# Patient Record
Sex: Male | Born: 2000 | Race: White | Hispanic: No | Marital: Single | Smoking: Current every day smoker
Health system: Southern US, Community
[De-identification: ages and names within clinical notes are randomized; demographics above are authoritative.]

## PROBLEM LIST (undated history)

## (undated) DIAGNOSIS — F909 Attention-deficit hyperactivity disorder, unspecified type: Secondary | ICD-10-CM

## (undated) HISTORY — DX: Attention-deficit hyperactivity disorder, unspecified type: F90.9

## (undated) HISTORY — PX: OTHER SURGICAL HISTORY: SHX169

---
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
## 2020-03-19 DIAGNOSIS — J029 Acute pharyngitis, unspecified: Secondary | ICD-10-CM | POA: Diagnosis not present

## 2020-03-19 DIAGNOSIS — Z00129 Encounter for routine child health examination without abnormal findings: Secondary | ICD-10-CM | POA: Diagnosis not present

## 2020-03-19 DIAGNOSIS — Z68.41 Body mass index (BMI) pediatric, less than 5th percentile for age: Secondary | ICD-10-CM | POA: Diagnosis not present

## 2020-09-11 ENCOUNTER — Emergency Department (HOSPITAL_COMMUNITY)
Admission: EM | Admit: 2020-09-11 | Discharge: 2020-09-11 | Disposition: A | Discharge status: Other Acute Inpatient Hospital | Payer: Medicaid Other | Source: Home / Self Care | Attending: Emergency Medicine | Admitting: Emergency Medicine

## 2020-09-11 ENCOUNTER — Encounter (HOSPITAL_COMMUNITY): Payer: Self-pay | Admitting: *Deleted

## 2020-09-11 ENCOUNTER — Other Ambulatory Visit: Payer: Self-pay

## 2020-09-11 ENCOUNTER — Emergency Department (HOSPITAL_COMMUNITY): Admission: EM | Admit: 2020-09-11 | Discharge: 2020-09-11 | Discharge status: Against Medical Advice | Payer: 59

## 2020-09-11 ENCOUNTER — Other Ambulatory Visit: Payer: Self-pay | Admitting: Psychiatric/Mental Health

## 2020-09-11 ENCOUNTER — Inpatient Hospital Stay (HOSPITAL_COMMUNITY)
Admission: AD | Admit: 2020-09-11 | Discharge: 2020-09-20 | DRG: 885 | Disposition: A | Discharge status: Home / Self Care | Payer: Medicaid Other | Source: Intra-hospital | Attending: Psychiatry | Admitting: Psychiatry

## 2020-09-11 DIAGNOSIS — F29 Unspecified psychosis not due to a substance or known physiological condition: Secondary | ICD-10-CM | POA: Diagnosis present

## 2020-09-11 DIAGNOSIS — F209 Schizophrenia, unspecified: Principal | ICD-10-CM | POA: Diagnosis present

## 2020-09-11 DIAGNOSIS — Z20822 Contact with and (suspected) exposure to covid-19: Secondary | ICD-10-CM | POA: Diagnosis present

## 2020-09-11 DIAGNOSIS — Z8249 Family history of ischemic heart disease and other diseases of the circulatory system: Secondary | ICD-10-CM | POA: Diagnosis not present

## 2020-09-11 DIAGNOSIS — F1910 Other psychoactive substance abuse, uncomplicated: Secondary | ICD-10-CM | POA: Insufficient documentation

## 2020-09-11 DIAGNOSIS — F1310 Sedative, hypnotic or anxiolytic abuse, uncomplicated: Secondary | ICD-10-CM | POA: Diagnosis present

## 2020-09-11 DIAGNOSIS — F1210 Cannabis abuse, uncomplicated: Secondary | ICD-10-CM | POA: Diagnosis present

## 2020-09-11 DIAGNOSIS — F1410 Cocaine abuse, uncomplicated: Secondary | ICD-10-CM | POA: Diagnosis present

## 2020-09-11 DIAGNOSIS — F1290 Cannabis use, unspecified, uncomplicated: Secondary | ICD-10-CM | POA: Insufficient documentation

## 2020-09-11 DIAGNOSIS — F17210 Nicotine dependence, cigarettes, uncomplicated: Secondary | ICD-10-CM | POA: Diagnosis present

## 2020-09-11 DIAGNOSIS — F23 Brief psychotic disorder: Secondary | ICD-10-CM | POA: Diagnosis not present

## 2020-09-11 DIAGNOSIS — Z046 Encounter for general psychiatric examination, requested by authority: Secondary | ICD-10-CM

## 2020-09-11 DIAGNOSIS — Z00129 Encounter for routine child health examination without abnormal findings: Secondary | ICD-10-CM

## 2020-09-11 DIAGNOSIS — K5900 Constipation, unspecified: Secondary | ICD-10-CM | POA: Diagnosis present

## 2020-09-11 DIAGNOSIS — I456 Pre-excitation syndrome: Secondary | ICD-10-CM | POA: Diagnosis present

## 2020-09-11 DIAGNOSIS — R258 Other abnormal involuntary movements: Secondary | ICD-10-CM | POA: Diagnosis not present

## 2020-09-11 DIAGNOSIS — R42 Dizziness and giddiness: Secondary | ICD-10-CM | POA: Insufficient documentation

## 2020-09-11 DIAGNOSIS — F419 Anxiety disorder, unspecified: Secondary | ICD-10-CM | POA: Diagnosis present

## 2020-09-11 DIAGNOSIS — F1994 Other psychoactive substance use, unspecified with psychoactive substance-induced mood disorder: Secondary | ICD-10-CM | POA: Diagnosis present

## 2020-09-11 DIAGNOSIS — G4700 Insomnia, unspecified: Secondary | ICD-10-CM | POA: Diagnosis present

## 2020-09-11 DIAGNOSIS — F909 Attention-deficit hyperactivity disorder, unspecified type: Secondary | ICD-10-CM | POA: Insufficient documentation

## 2020-09-11 DIAGNOSIS — R259 Unspecified abnormal involuntary movements: Secondary | ICD-10-CM | POA: Insufficient documentation

## 2020-09-11 LAB — COMPREHENSIVE METABOLIC PANEL
ALT: 13 U/L (ref 0–44)
AST: 19 U/L (ref 15–41)
Albumin: 4.8 g/dL (ref 3.5–5.0)
Alkaline Phosphatase: 58 U/L (ref 38–126)
Anion gap: 11 (ref 5–15)
BUN: 13 mg/dL (ref 6–20)
CO2: 25 mmol/L (ref 22–32)
Calcium: 10 mg/dL (ref 8.9–10.3)
Chloride: 101 mmol/L (ref 98–111)
Creatinine, Ser: 0.86 mg/dL (ref 0.61–1.24)
GFR, Estimated: >60 mL/min (ref >60)
Glucose, Bld: 90 mg/dL (ref 70–99)
Potassium: 4 mmol/L (ref 3.5–5.1)
Sodium: 137 mmol/L (ref 135–145)
Total Bilirubin: 0.6 mg/dL (ref 0.3–1.2)
Total Protein: 7.7 g/dL (ref 6.5–8.1)

## 2020-09-11 LAB — CBC
HCT: 45.8 % (ref 39.0–52.0)
Hemoglobin: 15.9 g/dL (ref 13.0–17.0)
MCH: 33.4 pg (ref 26.0–34.0)
MCHC: 34.7 g/dL (ref 30.0–36.0)
MCV: 96.2 fL (ref 80.0–100.0)
Platelets: 165 10*3/uL (ref 150–400)
RBC: 4.76 MIL/uL (ref 4.22–5.81)
RDW: 12.4 % (ref 11.5–15.5)
WBC: 9.4 10*3/uL (ref 4.0–10.5)
nRBC: 0 % (ref 0.0–0.2)

## 2020-09-11 LAB — ETHANOL: Alcohol, Ethyl (B): <10 mg/dL (ref <10)

## 2020-09-11 LAB — RESP PANEL BY RT-PCR (FLU A&B, COVID) ARPGX2
Influenza A by PCR: NEGATIVE
Influenza B by PCR: NEGATIVE
SARS Coronavirus 2 by RT PCR: NEGATIVE

## 2020-09-11 LAB — SALICYLATE LEVEL: Salicylate Lvl: <7 mg/dL — ABNORMAL LOW (ref 7.0–30.0)

## 2020-09-11 LAB — RAPID URINE DRUG SCREEN, HOSP PERFORMED
Amphetamines: NOT DETECTED
Barbiturates: NOT DETECTED
Benzodiazepines: NOT DETECTED
Cocaine: NOT DETECTED
Opiates: NOT DETECTED
Tetrahydrocannabinol: POSITIVE — AB

## 2020-09-11 LAB — ACETAMINOPHEN LEVEL: Acetaminophen (Tylenol), Serum: <10 ug/mL — ABNORMAL LOW (ref 10–30)

## 2020-09-11 MED ORDER — ACETAMINOPHEN 325 MG PO TABS
650.0000 mg | ORAL_TABLET | Freq: Four times a day (QID) | ORAL | Status: DC | PRN
  Administered 2020-09-17: 650 mg via ORAL
  Filled 2020-09-11: qty 2

## 2020-09-11 MED ORDER — STERILE WATER FOR INJECTION IJ SOLN
INTRAMUSCULAR | Status: AC
  Filled 2020-09-11: qty 10

## 2020-09-11 MED ORDER — LORAZEPAM 1 MG PO TABS
1.0000 mg | ORAL_TABLET | Freq: Every day | ORAL | Status: DC | PRN
  Administered 2020-09-12 – 2020-09-14 (×2): 1 mg via ORAL
  Filled 2020-09-11 (×2): qty 1

## 2020-09-11 MED ORDER — OLANZAPINE 10 MG IM SOLR
5.0000 mg | Freq: Once | INTRAMUSCULAR | Status: AC
  Administered 2020-09-11: 5 mg via INTRAMUSCULAR
  Filled 2020-09-11: qty 10

## 2020-09-11 MED ORDER — RISPERIDONE 1 MG PO TBDP
1.0000 mg | ORAL_TABLET | Freq: Two times a day (BID) | ORAL | Status: DC
  Administered 2020-09-12 (×2): 1 mg via ORAL
  Filled 2020-09-11 (×5): qty 1

## 2020-09-11 MED ORDER — ZIPRASIDONE MESYLATE 20 MG IM SOLR
20.0000 mg | Freq: Every day | INTRAMUSCULAR | Status: DC | PRN
  Administered 2020-09-14: 02:00:00 20 mg via INTRAMUSCULAR
  Filled 2020-09-11: qty 20

## 2020-09-11 MED ORDER — ALUM & MAG HYDROXIDE-SIMETH 200-200-20 MG/5ML PO SUSP: 30.0000 mL | ORAL | Status: DC | PRN

## 2020-09-11 MED ORDER — LORAZEPAM 2 MG/ML IJ SOLN: 1.0000 mg | Freq: Every day | INTRAMUSCULAR | Status: DC | PRN

## 2020-09-11 MED ORDER — MAGNESIUM HYDROXIDE 400 MG/5ML PO SUSP: 30.0000 mL | Freq: Every day | ORAL | Status: DC | PRN

## 2020-09-11 MED ORDER — OLANZAPINE 10 MG PO TBDP
10.0000 mg | ORAL_TABLET | Freq: Two times a day (BID) | ORAL | Status: DC | PRN
  Administered 2020-09-13: 22:00:00 10 mg via ORAL
  Filled 2020-09-11: qty 1

## 2020-09-11 MED ORDER — HYDROXYZINE HCL 25 MG PO TABS
25.0000 mg | ORAL_TABLET | Freq: Three times a day (TID) | ORAL | Status: DC | PRN
  Administered 2020-09-13 – 2020-09-19 (×5): 25 mg via ORAL
  Filled 2020-09-11 (×5): qty 1

## 2020-09-11 MED ORDER — TRAZODONE HCL 50 MG PO TABS: 50.0000 mg | ORAL_TABLET | Freq: Every evening | ORAL | Status: DC | PRN

## 2020-09-11 MED ORDER — RISPERIDONE 1 MG PO TBDP
1.0000 mg | ORAL_TABLET | Freq: Two times a day (BID) | ORAL | Status: DC
  Filled 2020-09-11 (×2): qty 1

## 2020-09-11 MED ORDER — ZIPRASIDONE MESYLATE 20 MG IM SOLR
20.0000 mg | Freq: Once | INTRAMUSCULAR | Status: AC
  Administered 2020-09-11: 20 mg via INTRAMUSCULAR
  Filled 2020-09-11: qty 20
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
## 2020-09-11 NOTE — ED Notes (Signed)
The pt reports that he is not suicidal or homicidal

## 2020-09-11 NOTE — ED Notes (Signed)
Patient reports seeing people sneaking around the department with assault rifles, says these people are whispering "no one is safe here".
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
## 2020-09-11 NOTE — BH Assessment (Signed)
Patient accepted to [facility] [id number] at 2000 pending a negative Covid test. Please call report to [phone]. Accepting provider is Harriett Sine, PMHNP. RN notified.

## 2020-09-11 NOTE — ED Notes (Signed)
Patient at desk calling father for update on impending transfer to [facility]
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
## 2020-09-11 NOTE — ED Notes (Signed)
Dad Daron Palin wants an update, said he was supposed to receive a call back earlier. [phone]

## 2020-09-12 ENCOUNTER — Encounter (HOSPITAL_COMMUNITY): Payer: Self-pay | Admitting: Psychiatric/Mental Health

## 2020-09-12 DIAGNOSIS — F23 Brief psychotic disorder: Secondary | ICD-10-CM | POA: Diagnosis not present

## 2020-09-12 MED ORDER — OLANZAPINE 10 MG PO TABS
10.0000 mg | ORAL_TABLET | Freq: Every day | ORAL | Status: DC
  Administered 2020-09-12 – 2020-09-13 (×2): 10 mg via ORAL
  Filled 2020-09-12 (×5): qty 1

## 2020-09-12 MED ORDER — TRAZODONE HCL 50 MG PO TABS
50.0000 mg | ORAL_TABLET | Freq: Every evening | ORAL | Status: DC | PRN
  Administered 2020-09-13 – 2020-09-19 (×5): 50 mg via ORAL
  Filled 2020-09-12 (×5): qty 1

## 2020-09-12 NOTE — Tx Team (Addendum)
Interdisciplinary Treatment and Diagnostic Plan Update  09/12/2020 Time of Session: 9:15am Edward [name] MRN: [id number]  Principal Diagnosis: <principal problem not specified>  Secondary Diagnoses: Active Problems:   Acute psychosis ([place])   Current Medications:  Current Facility-Administered Medications  Medication Dose Route Frequency Provider Last Rate Last Admin  . acetaminophen (TYLENOL) tablet 650 mg  650 mg Oral Q6H PRN Connye Burkitt, NP      . alum & mag hydroxide-simeth (MAALOX/MYLANTA) 200-200-20 MG/5ML suspension 30 mL  30 mL Oral Q4H PRN Connye Burkitt, NP      . hydrOXYzine (ATARAX/VISTARIL) tablet 25 mg  25 mg Oral TID PRN Connye Burkitt, NP      . LORazepam (ATIVAN) tablet 1 mg  1 mg Oral Daily PRN Connye Burkitt, NP       Or  . LORazepam (ATIVAN) injection 1 mg  1 mg Intramuscular Daily PRN Connye Burkitt, NP      . magnesium hydroxide (MILK OF MAGNESIA) suspension 30 mL  30 mL Oral Daily PRN Connye Burkitt, NP      . OLANZapine zydis (ZYPREXA) disintegrating tablet 10 mg  10 mg Oral BID PRN Connye Burkitt, NP      . risperiDONE (RISPERDAL M-TABS) disintegrating tablet 1 mg  1 mg Oral BID Connye Burkitt, NP   1 mg at 09/12/20 0827  . traZODone (DESYREL) tablet 50 mg  50 mg Oral QHS PRN,MR X 1 Connye Burkitt, NP      . ziprasidone (GEODON) injection 20 mg  20 mg Intramuscular Daily PRN Connye Burkitt, NP       PTA Medications: No medications prior to admission.    Patient Stressors: Marital or family conflict Substance abuse  Patient Strengths: General fund of knowledge Physical Health  Treatment Modalities: Medication Management, Group therapy, Case management,  1 to 1 session with clinician, Psychoeducation, Recreational therapy.   Physician Treatment Plan for Primary Diagnosis: <principal problem not specified> Long Term Goal(s):     Short Term Goals:    Medication Management: Evaluate patient's response, side effects, and tolerance of medication  regimen.  Therapeutic Interventions: 1 to 1 sessions, Unit Group sessions and Medication administration.  Evaluation of Outcomes: Not Met  Physician Treatment Plan for Secondary Diagnosis: Active Problems:   Acute psychosis ([place])  Long Term Goal(s):     Short Term Goals:       Medication Management: Evaluate patient's response, side effects, and tolerance of medication regimen.  Therapeutic Interventions: 1 to 1 sessions, Unit Group sessions and Medication administration.  Evaluation of Outcomes: Not Met   RN Treatment Plan for Primary Diagnosis: <principal problem not specified> Long Term Goal(s): Knowledge of disease and therapeutic regimen to maintain health will improve  Short Term Goals: Ability to remain free from injury will improve, Ability to verbalize frustration and anger appropriately will improve, Ability to identify and develop effective coping behaviors will improve and Compliance with prescribed medications will improve  Medication Management: RN will administer medications as ordered by provider, will assess and evaluate patient's response and provide education to patient for prescribed medication. RN will report any adverse and/or side effects to prescribing provider.  Therapeutic Interventions: 1 on 1 counseling sessions, Psychoeducation, Medication administration, Evaluate responses to treatment, Monitor vital signs and CBGs as ordered, Perform/monitor CIWA, COWS, AIMS and Fall Risk screenings as ordered, Perform wound care treatments as ordered.  Evaluation of Outcomes: Not Met   LCSW Treatment Plan for Primary Diagnosis: <  principal problem not specified> Long Term Goal(s): Safe transition to appropriate next level of care at discharge, Engage patient in therapeutic group addressing interpersonal concerns.  Short Term Goals: Engage patient in aftercare planning with referrals and resources, Increase social support, Increase ability to appropriately verbalize  feelings, Identify triggers associated with mental health/substance abuse issues and Increase skills for wellness and recovery  Therapeutic Interventions: Assess for all discharge needs, 1 to 1 time with Social worker, Explore available resources and support systems, Assess for adequacy in community support network, Educate family and significant other(s) on suicide prevention, Complete Psychosocial Assessment, Interpersonal group therapy.  Evaluation of Outcomes: Not Met   Progress in Treatment: Attending groups: No. Participating in groups: No. Taking medication as prescribed: Yes. Toleration medication: Yes. Family/Significant other contact made: No, will contact:  if consent is provided  Patient understands diagnosis: No. Discussing patient identified problems/goals with staff: Yes. Medical problems stabilized or resolved: Yes. Denies suicidal/homicidal ideation: Yes. Issues/concerns per patient self-inventory: No.   New problem(s) identified: No, Describe:  none  New Short Term/Long Term Goal(s): medication stabilization, elimination of SI thoughts, development of comprehensive mental wellness plan.   Patient Goals: "To get sleep"   Discharge Plan or Barriers: Patient is able to return to [facility] at discharge.   Reason for Continuation of Hospitalization: Delusions  Hallucinations Medication stabilization  Estimated Length of Stay: 3-5 days   Attendees: Patient: Edward [name] 09/12/2020   Physician: Lala Lund, MD 09/12/2020   Nursing:  09/12/2020   RN Care Manager: 09/12/2020   Social Worker: Darletta Moll, LCSW 09/12/2020   Recreational Therapist:  09/12/2020   Other:  09/12/2020   Other:  09/12/2020   Other: 09/12/2020      Scribe for Treatment Team: Vassie Moselle, LCSW 09/12/2020 10:46 AM

## 2020-09-12 NOTE — Progress Notes (Addendum)
Writer talked with pt explaining the dangers of drug and ETOH use. Writer talked about choices that need to be made in life and how to put yourself in line to make better choices to strive for better outcomes. Pt stated he was going to try to do the labs in the morning after discussing the importance of compliance, so labs were reordered for the am since they were not done , and pt will see if he can do it because he has a phobia with needles, pt encouraged to have staff there to help him get through it.     09/12/20 2000  Psych Admission Type (Psych Patients Only)  Admission Status Involuntary  Psychosocial Assessment  Patient Complaints Anxiety  Eye Contact Darting  Facial Expression Flat  Affect Flat  Speech Loud;Tangential  Interaction Forwards little;Guarded  Motor Activity Fidgety  Appearance/Hygiene Disheveled  Behavior Characteristics Anxious  Mood Preoccupied;Anxious  Thought Horticulturist, commercial of ideas;Disorganized  Content Blaming others;Paranoia  Delusions Paranoid  Perception WDL  Hallucination None reported or observed  Judgment Poor  Confusion Mild  Danger to Self  Current suicidal ideation? Denies  Danger to Others  Danger to Others None reported or observed

## 2020-09-12 NOTE — Tx Team (Signed)
Initial Treatment Plan 09/12/2020 12:38 AM Edward [name] RRN:[id number]    PATIENT STRESSORS: Marital or family conflict Substance abuse   PATIENT STRENGTHS: General fund of knowledge Physical Health   PATIENT IDENTIFIED PROBLEMS: Psychosis  Substance abuse    "Sleep better"               DISCHARGE CRITERIA:  Improved stabilization in mood, thinking, and/or behavior Need for constant or close observation no longer present Reduction of life-threatening or endangering symptoms to within safe limits Verbal commitment to aftercare and medication compliance  PRELIMINARY DISCHARGE PLAN: Outpatient therapy  PATIENT/FAMILY INVOLVEMENT: This treatment plan has been presented to and reviewed with the patient, Edward [name].  The patient and family have been given the opportunity to ask questions and make suggestions.  Windell Moment, RN 09/12/2020, 12:38 AM
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
## 2020-09-12 NOTE — [facility] Group Notes (Signed)
[facility] LCSW Group Therapy  09/12/2020 1:20 PM  Type of Therapy:  Psychoeducational Skills  Participation Level:  Minimal  Participation Quality:  Drowsy and Inattentive  Affect:  Flat  Cognitive:  Appropriate  Insight:  Poor  Engagement in Therapy:  Limited  Modes of Intervention:  Discussion and Rapport Building  Summary of Progress/Problems: Patient was on the phone at the beginning of group and CSW had to request for this patient to end the call several times. Patient was inattentive during group and was often seen dozing off. Patient offered limited engagement.    Ramesh Moan A Calix Heinbaugh 09/12/2020, 1:20 PM

## 2020-09-12 NOTE — [facility] Suicide Risk Assessment (Signed)
[facility] Admission Suicide Risk Assessment   Nursing information obtained from:  Patient Demographic factors:  Male,Caucasian Current Mental Status:  NA Loss Factors:  NA Historical Factors:  Impulsivity,Family history of mental illness or substance abuse Risk Reduction Factors:  Living with another person, especially a relative  Total Time spent with patient: 20 minutes Principal Problem: <principal problem not specified> Diagnosis:  Active Problems:   Acute psychosis ([place])  Subjective Data: 19 year old male presenting with psychotic symptoms.   Continued Clinical Symptoms:  Alcohol Use Disorder Identification Test Final Score (AUDIT): 0 The "Alcohol Use Disorders Identification Test", Guidelines for Use in Primary Care, Second Edition.  [employer] [facility]). Score between 0-7:  no or low risk or alcohol related problems. Score between 8-15:  moderate risk of alcohol related problems. Score between 16-19:  high risk of alcohol related problems. Score 20 or above:  warrants further diagnostic evaluation for alcohol dependence and treatment.   CLINICAL FACTORS:   Alcohol/Substance Abuse/Dependencies Currently Psychotic    See HPI for full mental status  COGNITIVE FEATURES THAT CONTRIBUTE TO RISK:  None    SUICIDE RISK:   Moderate:  Frequent suicidal ideation with limited intensity, and duration, some specificity in terms of plans, no associated intent, good self-control, limited dysphoria/symptomatology, some risk factors present, and identifiable protective factors, including available and accessible social support.  PLAN OF CARE: Continue treatment on inpatient basis  I certify that inpatient services furnished can reasonably be expected to improve the patient's condition.   Dixie Dials, MD 09/12/2020, 1:53 PM

## 2020-09-12 NOTE — Progress Notes (Signed)
Dar Note: Patient presents with irritable affect and mood.  Verbally aggressive towards staff and peers.  Medication given as prescribed with lot of encouragement.  Ativan 1 mg prn given for agitation with good effect.  Denies suicidal thoughts, auditory and visual hallucinations.  Routine safety checks maintained.  Attended groups with minimal participation.  Patient is safe on the unit.

## 2020-09-12 NOTE — Progress Notes (Signed)
Recreation Therapy Notes  Date: 12.10.21 Time: 1000 Location: 500 Hall Dayroom  Group Topic: Wellness  Goal Area(s) Addresses:  Patient will define components of whole wellness. Patient will verbalize benefit of whole wellness.  Intervention: Music  Activity: Exercise.  LRT led patients in a series of stretches to loosen up the body.  Patients were then given the opportunity to lead the group in exercises of their choosing.  The group was to get at least 30 minutes of exercise.  Patients could take breaks and get water as needed.  Education: Wellness, Dentist.   Education Outcome: Acknowledges education/In group clarification offered/Needs additional education.   Clinical Observations/Feedback: Pt did not attend group session.   Victorino Sparrow, LRT/CTRS        Victorino Sparrow A 09/12/2020 11:41 AM

## 2020-09-12 NOTE — Progress Notes (Signed)
Pt up to the nursing station agitated. Pt stated he could get labs done because he already had 3 other shots in his arm. Pt stated he had "a phobia with needles" pt very confrontational with staff, pt informed to discuss his issues with the doctor.

## 2020-09-12 NOTE — Progress Notes (Signed)
Recreation Therapy Notes  12.10.21 1302:  LRT went to patient room to complete recreation therapy assessment.  Pt was asleep.  LRT will attempt to complete assessment at a later time.    Victorino Sparrow, LRT/CTRS    Ria Comment, Airiel Oblinger A 09/12/2020 1:23 PM
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
## 2020-09-13 DIAGNOSIS — F23 Brief psychotic disorder: Secondary | ICD-10-CM | POA: Diagnosis not present

## 2020-09-13 DIAGNOSIS — F1410 Cocaine abuse, uncomplicated: Secondary | ICD-10-CM | POA: Insufficient documentation

## 2020-09-13 DIAGNOSIS — F1510 Other stimulant abuse, uncomplicated: Secondary | ICD-10-CM | POA: Insufficient documentation

## 2020-09-13 DIAGNOSIS — F1310 Sedative, hypnotic or anxiolytic abuse, uncomplicated: Secondary | ICD-10-CM | POA: Diagnosis not present

## 2020-09-13 DIAGNOSIS — F1210 Cannabis abuse, uncomplicated: Secondary | ICD-10-CM | POA: Diagnosis present

## 2020-09-13 MED ORDER — OLANZAPINE 10 MG PO TABS
10.0000 mg | ORAL_TABLET | Freq: Two times a day (BID) | ORAL | Status: DC
  Administered 2020-09-13 – 2020-09-15 (×4): 10 mg via ORAL
  Filled 2020-09-13 (×8): qty 1

## 2020-09-13 NOTE — Progress Notes (Signed)
   09/12/20 2000  Psych Admission Type (Psych Patients Only)  Admission Status Involuntary  Psychosocial Assessment  Patient Complaints Anxiety  Eye Contact Darting  Facial Expression Flat  Affect Flat  Speech Loud;Tangential  Interaction Forwards little;Guarded  Motor Activity Fidgety  Appearance/Hygiene Disheveled  Behavior Characteristics Anxious  Mood Preoccupied;Anxious  Thought Horticulturist, commercial of ideas;Disorganized  Content Blaming others;Paranoia  Delusions Paranoid  Perception WDL  Hallucination None reported or observed  Judgment Poor  Confusion Mild  Danger to Self  Current suicidal ideation? Denies  Danger to Others  Danger to Others None reported or observed  Dar Note: Patient presents with irritable affect and mood.  Medication given as prescribed.  Routine safety checks maintained.  Patient placed on unit restriction due to elopement risk.  Denies suicidal thoughts, auditory and visual hallucinations. Patient is safe on the unit.

## 2020-09-13 NOTE — Progress Notes (Signed)
[facility] MD Progress Note  09/13/2020 1:21 PM Edward [name]  MRN:  [id number] Subjective:   Edward [name] is a 19 yr old male who presented under IVC with psychotic behavior. PPHx is significant for substance induced mood disorder and ADHD.   He reports that starting the medicine has caused some lightheadedness/Dizziness and fatigue. He reports that his sleep is "ok" and that his appetite is not great. Discussed that nurse had spoken to him about getting blood this morning but that it was not done. He reports that he is willing to get blood work done but they did not ask him, he reports he is willing to get it done tomorrow. During our interview he was fidgeting and anxious appearing. He was willing to speak with me and kept calm during it, but he appeared to be responding to internal stimuli. When asked if he was speaking to dead people he paused and then gave a noncommittal answer. He reports no SI, HI, or AVH.    Spoke with patients father at 2:45 PM. Father reports that his son has had trouble for a few years. That he had taken him to the ED a few times and when he was younger to a facility for children. He reports that the hallucinations are new and that when he was told in the ED that the drug screen showed "only THC" he was floored. He states that patients friends had reported patient was asking around for Benzos. He asked when he would be released and what state he would be released in. Discussed that the process is a day by day process, each day the patient will be evaluated and changes to medications needed would be made and he would be watched for reaction. He would not be released until he is stable. Father reports that [facility] would take him back but that they could not handle him in his current state. He has no other questions at this time.  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 Patient has been to the ED at least 3 times prior to this admission and was diagnosed with substance induced psychosis. His UDS was positive for cocaine, THC, amphetamines, and benzos at the last visit. No amphetamines were detected in the visit prior to that and no cocaine was detected in the first appearance. It is possible that the amphetamines were 2/2 his prescribed Vyvanse and the Benzos were 2/2 benzos provided in the ED. Patient is no longer on Vyvanse at this admission.  Past Medical History:  Past Medical History:  Diagnosis Date   ADHD (attention deficit hyperactivity disorder)     Past Surgical History:  Procedure Laterality Date   tubes in ears     Family History: History reviewed. No pertinent family history. Family Psychiatric  History: None reported by patient other substance use disorder EtoH in mother. Social History:  Social History   Substance and Sexual Activity  Alcohol Use Not Currently   Comment: occ     Social History   Substance and Sexual Activity  Drug Use Yes   Frequency: 2.0 times  per week   Types: Marijuana    Social History   Socioeconomic History   Marital status: Single    Spouse name: Not on file   Number of children: Not on file   Years of education: Not on file   Highest education level: Not on file  Occupational History   Not on file  Tobacco Use   Smoking status: Current Every Day Smoker    Packs/day: 0.50    Types: Cigarettes   Smokeless tobacco: Never Used  Vaping Use   Vaping Use: Some days  Substance and Sexual Activity   Alcohol use: Not Currently    Comment: occ   Drug use: Yes     Frequency: 2.0 times per week    Types: Marijuana   Sexual activity: Not on file  Other Topics Concern   Not on file  Social History Narrative   Not on file   Social Determinants of Health   Financial Resource Strain: Not on file  Food Insecurity: Not on file  Transportation Needs: Not on file  Physical Activity: Not on file  Stress: Not on file  Social Connections: Not on file   Additional Social History:                         Sleep: Fair  Appetite:  Poor  Current Medications: Current Facility-Administered Medications  Medication Dose Route Frequency Provider Last Rate Last Admin   acetaminophen (TYLENOL) tablet 650 mg  650 mg Oral Q6H PRN Connye Burkitt, NP       alum & mag hydroxide-simeth (MAALOX/MYLANTA) 200-200-20 MG/5ML suspension 30 mL  30 mL Oral Q4H PRN Connye Burkitt, NP       hydrOXYzine (ATARAX/VISTARIL) tablet 25 mg  25 mg Oral TID PRN Connye Burkitt, NP       LORazepam (ATIVAN) tablet 1 mg  1 mg Oral Daily PRN Connye Burkitt, NP   1 mg at 09/12/20 1614   Or   LORazepam (ATIVAN) injection 1 mg  1 mg Intramuscular Daily PRN Connye Burkitt, NP       magnesium hydroxide (MILK OF MAGNESIA) suspension 30 mL  30 mL Oral Daily PRN Connye Burkitt, NP       OLANZapine (ZYPREXA) tablet 10 mg  10 mg Oral Daily Damita Dunnings B, MD   10 mg at 09/13/20 0755   OLANZapine zydis (ZYPREXA) disintegrating tablet 10 mg  10 mg Oral BID PRN Connye Burkitt, NP       traZODone (DESYREL) tablet 50 mg  50 mg Oral QHS PRN Damita Dunnings B, MD       ziprasidone (GEODON) injection 20 mg  20 mg Intramuscular Daily PRN Connye Burkitt, NP        Lab Results:  Results for orders placed or performed during the hospital encounter of 09/11/20 (from the past 48 hour(s))  Resp Panel by RT-PCR (Flu A&B, Covid) Nasopharyngeal Swab     Status: None   Collection Time: 09/11/20  6:33 PM   Specimen: Nasopharyngeal Swab; Nasopharyngeal(NP) swabs in vial transport medium  Result  Value Ref Range   SARS Coronavirus 2 by RT PCR NEGATIVE NEGATIVE    Comment: (NOTE) SARS-CoV-2 target nucleic acids are NOT DETECTED.  The SARS-CoV-2 RNA is generally detectable in upper respiratory specimens during the acute phase of infection. The lowest concentration of SARS-CoV-2 viral copies this assay can detect is 138 copies/mL. A negative result does not  preclude SARS-Cov-2 infection and should not be used as the sole basis for treatment or other patient management decisions. A negative result may occur with  improper specimen collection/handling, submission of specimen other than nasopharyngeal swab, presence of viral mutation(s) within the areas targeted by this assay, and inadequate number of viral copies(<138 copies/mL). A negative result must be combined with clinical observations, patient history, and epidemiological information. The expected result is Negative.  Fact Sheet for Patients:  EntrepreneurPulse.com.au  Fact Sheet for Healthcare Providers:  IncredibleEmployment.be  This test is no t yet approved or cleared by the [place] FDA and  has been authorized for detection and/or diagnosis of SARS-CoV-2 by FDA under an Emergency Use Authorization (EUA). This EUA will remain  in effect (meaning this test can be used) for the duration of the COVID-19 declaration under Section 564(b)(1) of the Act, 21 U.S.C.section 360bbb-3(b)(1), unless the authorization is terminated  or revoked sooner.       Influenza A by PCR NEGATIVE NEGATIVE   Influenza B by PCR NEGATIVE NEGATIVE    Comment: (NOTE) The Xpert Xpress SARS-CoV-2/FLU/RSV plus assay is intended as an aid in the diagnosis of influenza from Nasopharyngeal swab specimens and should not be used as a sole basis for treatment. Nasal washings and aspirates are unacceptable for Xpert Xpress SARS-CoV-2/FLU/RSV testing.  Fact Sheet for  Patients: EntrepreneurPulse.com.au  Fact Sheet for Healthcare Providers: IncredibleEmployment.be  This test is not yet approved or cleared by the [place] FDA and has been authorized for detection and/or diagnosis of SARS-CoV-2 by FDA under an Emergency Use Authorization (EUA). This EUA will remain in effect (meaning this test can be used) for the duration of the COVID-19 declaration under Section 564(b)(1) of the Act, 21 U.S.C. section 360bbb-3(b)(1), unless the authorization is terminated or revoked.  Performed at [facility], [place] [address], [place], [place] [address]     Blood Alcohol level:  Lab Results  Component Value Date   ETH <10 09/11/2020   ETH <10 62/12/5595    Metabolic Disorder Labs: No results found for: HGBA1C, MPG No results found for: PROLACTIN No results found for: CHOL, TRIG, HDL, CHOLHDL, VLDL, LDLCALC  Physical Findings: AIMS: Facial and Oral Movements Muscles of Facial Expression: None, normal Lips and Perioral Area: None, normal Jaw: None, normal Tongue: None, normal,Extremity Movements Upper (arms, wrists, hands, fingers): None, normal Lower (legs, knees, ankles, toes): None, normal, Trunk Movements Neck, shoulders, hips: None, normal, Overall Severity Severity of abnormal movements (highest score from questions above): None, normal Incapacitation due to abnormal movements: None, normal Patient's awareness of abnormal movements (rate only patient's report): No Awareness, Dental Status Current problems with teeth and/or dentures?: No Does patient usually wear dentures?: No  CIWA:    COWS:     Musculoskeletal: Strength & Muscle Tone: within normal limits Gait & Station: normal Patient leans: N/A  Psychiatric Specialty Exam: Physical Exam Nursing note reviewed. Vitals reviewed: Vitals not taken.  Constitutional:      General: He is not in acute distress.    Appearance: Normal appearance. He  is normal weight. He is not ill-appearing, toxic-appearing or diaphoretic.  HENT:     Head: Normocephalic and atraumatic.  Pulmonary:     Effort: Pulmonary effort is normal.  Musculoskeletal:        General: Normal range of motion.  Neurological:     General: No focal deficit present.     Mental Status: He is alert.     Review of  Systems  Constitutional: Positive for fatigue. Negative for fever.  Respiratory: Negative for chest tightness and shortness of breath.   Cardiovascular: Negative for chest pain and palpitations.  Gastrointestinal: Negative for abdominal pain, constipation, diarrhea, nausea and vomiting.  Neurological: Positive for dizziness and light-headedness. Negative for weakness and headaches.  Psychiatric/Behavioral: Positive for agitation. Negative for self-injury and suicidal ideas. The patient is nervous/anxious and is hyperactive.     Blood pressure 128/78, pulse 81, temperature 98 F (36.7 C), temperature source Oral, resp. rate 18, height 5\' 9"  (1.753 m), weight 60.3 kg, SpO2 98 %.Body mass index is 19.64 kg/m.  General Appearance: Bizarre and Disheveled  Eye Contact:  Poor  Speech:  Garbled and Slow  Volume:  Normal  Mood:  Anxious and Irritable  Affect:  Anxious and Irritable  Thought Process:  Disorganized  Orientation:  Full (Time, Place, and Person)  Thought Content:  Logical and Paranoid Ideation  Suicidal Thoughts:  No  Homicidal Thoughts:  No  Memory:  Immediate;   Fair Recent;   Fair  Judgement:  Impaired  Insight:  Shallow  Psychomotor Activity:  Restlessness  Concentration:  Concentration: Poor and Attention Span: Poor  Recall:  [place] of Knowledge:  Good  Language:  Good  Akathisia:  Negative  Handed:  Right  AIMS (if indicated):     Assets:  Physical Health Resilience  ADL's:  Intact  Cognition:  WNL  Sleep:  Number of Hours: 5.75     Treatment Plan Summary: Daily contact with patient to assess and evaluate symptoms and  progress in treatment  Patient is willing to get blood work will attempt again tomorrow- A1C, Lipid Panel, TSH. He appears to be continuing to respond to internal stimuli and is agitated/anxious/fidgetting.Will not attempt CT scan today as he is still a flight risk. Will increase Zyprexa. Given his WPW from initial EKG will get repeat EKG tomorrow to monitor QTc for changes.  -Increase Zyprexa to 10 mg BID  -Continue PRN: Tylenol, Maalox, Atarax, Ativan, Milk of Magnesia, Zyprexa, and Geodon.  Briant Cedar, MD 09/13/2020, 1:21 PM

## 2020-09-13 NOTE — Progress Notes (Signed)
Pt keeps coming in and out of his room with tangential speech. Pt nervous and fidgety. Pt given Zyprexa zydis10 mg PRN. Will continue to monitor.

## 2020-09-13 NOTE — B[facility] Counselor (Signed)
Clinical Social Work Note  CSW met with patient in his room to do Psychosocial Assessment, but he was unable to answer basic questions, appeared to be responding to internal stimuli.  He was only able to say that he needs to be in the hospital to "better myself and get away for a little bit."  After this, his responses were not on topic and incomprehensible.  CSW to attempt again tomorrow.  He did sign a consent to talk with his mother Dominiq Fontaine readily.  Selmer Dominion, LCSW 09/13/2020, 2:03 PM

## 2020-09-13 NOTE — Progress Notes (Addendum)
   09/13/20 2026  Psych Admission Type (Psych Patients Only)  Admission Status Involuntary  Psychosocial Assessment  Patient Complaints Restlessness  Eye Contact Darting  Facial Expression Flat  Affect Flat  Speech Tangential  Interaction Forwards little;Guarded  Motor Activity Fidgety  Appearance/Hygiene Disheveled  Behavior Characteristics Anxious  Mood Anxious;Preoccupied  Thought Horticulturist, commercial of ideas;Disorganized  Content Paranoia  Delusions Paranoid  Perception WDL  Hallucination None reported or observed  Judgment Poor  Confusion Mild  Danger to Self  Current suicidal ideation? Denies  Danger to Others  Danger to Others None reported or observed   Pt seen in hallway. Shows his arm to this Probation officer. "They say I need a shot tomorrow. Where will I get it? They say there are three places that I can get it." Pt not talking about a shot but deciphered he was speaking about blood work in the morning. Pt showed where they will draw blood. Pt advised to look away if he is afraid of needles. Pt disorganized thought process. Denies SI, HI, AVH and pain.

## 2020-09-13 NOTE — Progress Notes (Signed)
Adult Psychoeducational Group Note  Date:  09/13/2020 Time:  11:45 PM  Group Topic/Focus:  Wrap-Up Group:   The focus of this group is to help patients review their daily goal of treatment and discuss progress on daily workbooks.  Participation Level:  Minimal  Participation Quality:  Inattentive  Affect:  Anxious  Cognitive:  Disorganized and Confused  Insight: Limited  Engagement in Group:  Limited  Modes of Intervention:  Discussion  Additional Comments:  Pt stated his goal for today was to focus on his treatment plan. Pt stated he accomplished his goal today. Pt stated he was able to talk with his doctor and social worker about his care today. Pt rated his overall day a 7 out of 10. Pt stated the only negative thing that happen today was he was involved in a verbal altercation with his mother.Pt stated his relationship with his family and support system needs to be improved. Pt state he was able to contact his parents today, which improved his day. Pt stated he was able to attend all meals. Pt stated he took all medications provided today. Pt stated he felt better about himself today. Pt stated his appetite was pretty good today. Pt rated sleep last night was pretty good. Pt stated he was no physical pain today. Pt deny auditory or visual hallucinations. Pt denies thoughts of harming himself or others. Pt stated he would alert staff if anything changes.   Candy Sledge 09/13/2020, 11:45 PM

## 2020-09-13 NOTE — [facility] Group Notes (Signed)
.  Psychoeducational Group Note  Date: 09-13-20 Time: 0900-1000    Goal Setting   Purpose of Group: This group helps to provide patients with the steps of setting a goal that is specific, measurable, attainable, realistic and time specific. A discussion on how we keep ourselves stuck with negative self talk.    Participation Level:  Did not participate, however he was in the room.  Participation Quality:  Did not participate  Affect:  Sat in his chair, just starring out.   Cognitive:  limited  Insight:  lacking  Engagement in Group:  No engagement at all. Sat in his chair, looking for his phone.   Additional Comments:   Was becoming upset due to not being able to find his phone and got up and walked out of the room  Edward [name] A

## 2020-09-13 NOTE — [facility] Group Notes (Signed)
  [facility] LCSW Group Therapy Note  Date/Time:  09/13/2020 11:15AM-12:00PM  Type of Therapy and Topic:  Group Therapy:  Feelings About Hospitalization  Participation Level:  Minimal   Description of Group This process group involved patients discussing their feelings related to being hospitalized, as well as the benefits they see to being in the hospital.  These feelings and benefits were itemized.  The group then brainstormed specific ways in which they could seek those same benefits when they discharge and return home.  Therapeutic Goals 1. Patient will identify and describe positive and negative feelings related to hospitalization 2. Patient will verbalize benefits of hospitalization to themselves personally 3. Patients will brainstorm together ways they can obtain similar benefits in the outpatient setting, identify barriers to wellness and possible solutions  Summary of Patient Progress:  The patient expressed his primary feelings about being hospitalized are "it's one of the worst things, I'm always looking for that needle" and spoke for a longer than necessary amount of time about his resistance to being "stuck" with a needle.  He perseverated that there is "no reason at all, ever" for someone to put a needle in his body.  He was irritable and had no eye contact with anyone in the room despite the fact that he took up quite a bit of time in sharing his feelings.  Therapeutic Modalities Cognitive Behavioral Therapy Motivational Interviewing    Selmer Dominion, LCSW 09/13/2020, 1:35 PM

## 2020-09-13 NOTE — [facility] Counselor (Signed)
Clinical Social Work Note  CSW spoke with patient's mother Edward [name] [phone] with patient's written consent.  Mother stated that she has heard from patient's father who updated her about conversation with doctor.  She would appreciate updates from social work team and will update father.  Parents are divorced and she stated that when patient was little, there was very bad interaction between parents that had an impact on patient and his older brother, who has been in this facility at least 2 times.  She said that patient has a history of depression, dropped out of school in 9th grade.  She was not able to answer whether patient has ever had depressive episodes without substance abuse also being present, saying simply that he has taken many substances for a very long time.  She stated this is the worst they have ever seen him, and he has been calling them "out of his head."  He stayed with her last weekend, was very depressed and tearful, said he had been smoking marijuana but also doing Xanax bars.    Mother was initially worried that the medications patient is on were causing his psychosis.  CSW assured her that patient's reports to her about Tramadol at [facility] were delusional, as that is not on their formulary.  It would seem his psychosis predates the medicines, and this was a relief to her to hear.  Mother and father have discussed the situation and believe that patient cannot return to [facility] because it is not a locked facility.  Even as mother drove him there, he was telling her he was going to leave despite what father paid for the treatment.  Parents are in agreement that patient needs to go to a 30-day locked facility for further substance abuse treatment when he is released from this hospital.  CSW explained the limitations of treatment facilities and that patient will be the one ultimately to make the choice.  Mother stated she feels like they are finally making progress  by having contact with staff.  Also, patient provided his code which is a positive step.  Selmer Dominion, LCSW 09/13/2020, 5:18 PM

## 2020-09-14 DIAGNOSIS — F29 Unspecified psychosis not due to a substance or known physiological condition: Secondary | ICD-10-CM | POA: Diagnosis present

## 2020-09-14 NOTE — B[facility] Counselor (Signed)
Clinical Social Work Note  CSW attempted to meet with patient to do Psychosocial Assessment.  He was sleeping soundly after sedating medicine was given when he did not sleep all night.  CSW team will continues attempts.  Selmer Dominion, LCSW 09/14/2020, 2:06 PM

## 2020-09-14 NOTE — Progress Notes (Addendum)
[facility] MD Progress Note  09/14/2020 3:44 PM Edward [name]  MRN:  [id number] Subjective:  Patient reports that he recalls having a "vivid dream" last night very similar to when he was in the ED. Patient reports that he knows that he got an injection because of what occurred when he was having the dream. Patient reports that he does not remember the dream itself but he knows that they are scary and it is hard for him to know what is real. He remembers walking into the hall last night confused and feeling that he was still in a dream-like state and he also recalls being angry that the staff came into his room with the injection. Patient reports that it was painful but he was able to sleep after and later got up for lunch. Patient does not endorse AVH. Patient does contract for safety. Upon further questioning of patient's strange dreams and reaction patient is now able to say that this is the reason that [facility] IVC'd him as he can now recall something similar happened and he recalls the same thing happening in the ED. Patient reports he had not told his family this was happening until today when he called his dad to tell him that these strange dream states have been happening and they scare him. Patient is also able to reports that he actually went to [facility] to detox from [facility] and that they likely were not found in his urine as he had 3 days of sobriety at his mother's before going to [facility]. Principal Problem: Acute psychosis ([place]) Diagnosis: Principal Problem:   Acute psychosis ([place]) Active Problems:   Seasonal allergies   Polysubstance abuse ([place])   Substance induced mood disorder (H[facility])   Marijuana abuse   Cocaine use disorder, mild, abuse ([place])   Benzodiazepine abuse ([place])   Amphetamine abuse ([place])  Total Time spent with patient: 30 minutes  Past Psychiatric History: See H&P  Past Medical History:  Past Medical History:  Diagnosis Date   ADHD (attention deficit  hyperactivity disorder)     Past Surgical History:  Procedure Laterality Date   tubes in ears     Family History: History reviewed. No pertinent family history. Family Psychiatric  History: See H&P Social History:  Social History   Substance and Sexual Activity  Alcohol Use Not Currently   Comment: occ     Social History   Substance and Sexual Activity  Drug Use Yes   Frequency: 2.0 times per week   Types: Marijuana    Social History   Socioeconomic History   Marital status: Single    Spouse name: Not on file   Number of children: Not on file   Years of education: Not on file   Highest education level: Not on file  Occupational History   Not on file  Tobacco Use   Smoking status: Current Every Day Smoker    Packs/day: 0.50    Types: Cigarettes   Smokeless tobacco: Never Used  Vaping Use   Vaping Use: Some days  Substance and Sexual Activity   Alcohol use: Not Currently    Comment: occ   Drug use: Yes    Frequency: 2.0 times per week    Types: Marijuana   Sexual activity: Not on file  Other Topics Concern   Not on file  Social History Narrative   Not on file   Social Determinants of Health   Financial Resource Strain: Not on file  Food Insecurity:  Not on file  Transportation Needs: Not on file  Physical Activity: Not on file  Stress: Not on file  Social Connections: Not on file   Additional Social History:                         Sleep: Poor  Appetite:  Fair  Current Medications: Current Facility-Administered Medications  Medication Dose Route Frequency Provider Last Rate Last Admin   acetaminophen (TYLENOL) tablet 650 mg  650 mg Oral Q6H PRN Connye Burkitt, NP       alum & mag hydroxide-simeth (MAALOX/MYLANTA) 200-200-20 MG/5ML suspension 30 mL  30 mL Oral Q4H PRN Connye Burkitt, NP       hydrOXYzine (ATARAX/VISTARIL) tablet 25 mg  25 mg Oral TID PRN Connye Burkitt, NP   25 mg at 09/13/20 2233   LORazepam (ATIVAN)  tablet 1 mg  1 mg Oral Daily PRN Connye Burkitt, NP   1 mg at 09/14/20 4081   Or   LORazepam (ATIVAN) injection 1 mg  1 mg Intramuscular Daily PRN Connye Burkitt, NP       magnesium hydroxide (MILK OF MAGNESIA) suspension 30 mL  30 mL Oral Daily PRN Connye Burkitt, NP       OLANZapine (ZYPREXA) tablet 10 mg  10 mg Oral BID Briant Cedar, MD   10 mg at 09/14/20 1241   OLANZapine zydis (ZYPREXA) disintegrating tablet 10 mg  10 mg Oral BID PRN Connye Burkitt, NP   10 mg at 09/13/20 2156   traZODone (DESYREL) tablet 50 mg  50 mg Oral QHS PRN Damita Dunnings B, MD   50 mg at 09/13/20 2233   ziprasidone (GEODON) injection 20 mg  20 mg Intramuscular Daily PRN Connye Burkitt, NP   20 mg at 09/14/20 0219    Lab Results: No results found for this or any previous visit (from the past 59 hour(s)).  Blood Alcohol level:  Lab Results  Component Value Date   ETH <10 09/11/2020   ETH <10 44/81/8563    Metabolic Disorder Labs: No results found for: HGBA1C, MPG No results found for: PROLACTIN No results found for: CHOL, TRIG, HDL, CHOLHDL, VLDL, LDLCALC  Physical Findings: AIMS: Facial and Oral Movements Muscles of Facial Expression: None, normal Lips and Perioral Area: None, normal Jaw: None, normal Tongue: None, normal,Extremity Movements Upper (arms, wrists, hands, fingers): None, normal Lower (legs, knees, ankles, toes): None, normal, Trunk Movements Neck, shoulders, hips: None, normal, Overall Severity Severity of abnormal movements (highest score from questions above): None, normal Incapacitation due to abnormal movements: None, normal Patient's awareness of abnormal movements (rate only patient's report): No Awareness, Dental Status Current problems with teeth and/or dentures?: No Does patient usually wear dentures?: No  CIWA:    COWS:     Musculoskeletal: Strength & Muscle Tone: within normal limits Gait & Station: Patient still has somewhat of a limp as he reports that  his Left leg is numb sometimes after waking up and he does have some pain in his L hip. 4/5 strength bilaterally Patient leans: N/A  Psychiatric Specialty Exam: Physical Exam Constitutional:      Comments: Patient appears to have some full body tremors that decrease over time during the interview.   HENT:     Head: Normocephalic and atraumatic.  Pulmonary:     Effort: Pulmonary effort is normal.  Neurological:     Mental Status: He is alert.  Review of Systems  Blood pressure (!) 152/69, pulse 79, temperature 98 F (36.7 C), temperature source Oral, resp. rate 18, height 5\' 9"  (1.753 m), weight 60.3 kg, SpO2 98 %.Body mass index is 19.64 kg/m.  General Appearance: Casual  Eye Contact:  Fair  Speech:  Clear and Coherent  Volume:  Normal  Mood:  Euthymic  Affect:  Congruent  Thought Process:  Coherent  Orientation:  Full (Time, Place, and Person)  Thought Content:  Logical  Suicidal Thoughts:  No  Homicidal Thoughts:  No  Memory:  Recent;   Fair  Judgement:  Fair  Insight:  Shallow  Psychomotor Activity:  Normal  Concentration:  Concentration: Fair  Recall:  NA  Fund of Knowledge:  Fair  Language:  Fair  Akathisia:  No  Handed:  Right  AIMS (if indicated):     Assets:  Communication Skills Leisure Time  ADL's:  Intact  Cognition:  WNL  Sleep:  Number of Hours: 0     Treatment Plan Summary: Daily contact with patient to assess and evaluate symptoms and progress in treatment  Mr. Head is a 19yo patient who continues to endorse bizarre behavior and is being treated for Acute Psychosis. Patient had an episode overnight where he felt that he was having a vivid dream and began having bizarre behavior and was very agitated and required Geodon in the early AM. The episode resembles something similar that happened in the ED before he was admitted to [facility]. Patient is scared by the events. Because the events tend to happen independently of patient having recently used MJ is  not likely his psychosis is 2/2 cannabis use. Patient does report that he is detoxing from benzos; however he reports that while he started using them at 16 he does not think he started using them regularly until this year. Patient has a hx of psychosis in the EMR via ED visits starting around age 27. It is possible that patient's bizarre behavior is a reaction to him discontinuing benzos or could also be independent of his lack of benzo's. Patient continues to require monitoring. 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 Patient does not have a prior dx of this and does not have andy prior cardiac hx. Cardiology has been consulted.   Acute Psychosis Labs: TSH, A1c and Lipid panel , patient is now more willing -  CT Head wo Contrast - Zyprexa 10mg  BID  WPW workup -Cardiology consulted  PRN -Tylenol 650mg  q6h, pain -Maalox 52ml q4h, constipation -Atarax 25mg  TID, anxiety - Zyprexa 10mg  PRN, agitation - Ativan 1mg  PRN -Geodon 20mg , agiation -Milk of Mag 35mL, constipation -Trazodone 50mg  QHS, insomnia  Freida Busman, MD 09/14/2020, 3:44 PM

## 2020-09-14 NOTE — Progress Notes (Signed)
Pt yawning and standing in the hallway. Pt internally preoccupied. This writer speaking to pt but pt not responding to me but looking at his fingers and speaking to someone not there. Pt redirected to return to room. Pt slow to respond, still standing at end of hall by the window. Pt finally returned to his room.

## 2020-09-14 NOTE — BHH Group Notes (Signed)
[place] LCSW Group Therapy Note  Date/Time:  09/14/2020  11:00AM-12:00PM  Type of Therapy and Topic:  Group Therapy:  Music and Mood  Participation Level:  Did Not Attend   Description of Group: In this process group, members listened to a variety of genres of music and identified that different types of music evoke different responses.  Patients were encouraged to identify music that was soothing for them and music that was energizing for them.  Patients discussed how this knowledge can help with wellness and recovery in various ways including managing depression and anxiety as well as encouraging healthy sleep habits.    Therapeutic Goals: Patients will explore the impact of different varieties of music on mood Patients will verbalize the thoughts they have when listening to different types of music Patients will identify music that is soothing to them as well as music that is energizing to them Patients will discuss how to use this knowledge to assist in maintaining wellness and recovery Patients will explore the use of music as a coping skill  Summary of Patient Progress:  Did not attend  Therapeutic Modalities: Solution Focused Brief Therapy Activity   Selmer Dominion, LCSW

## 2020-09-14 NOTE — Progress Notes (Signed)
Pt coming out the room. Prepared Geodon 20mg  IM and 1 mg Ativan PO. Pt cursing and posturing when told about the injection. "Yall not giving me that shot. I don't care what the fuck you say, I'm not taking that shot. I was sleeping and yall woke me out of sleep for a fucking shot. No way!" Pt was not sleep and has not been asleep. Pt labile, crying one moment and cursing the next.  Pt agreed to let this writer give him the Geodon to right deltoid. Pt talked through the shot to further try and calm him down. Injection given. Will continue to monitor.

## 2020-09-14 NOTE — Progress Notes (Addendum)
Pt woke up around lunch time and took his medications as ordered. A & O to self and place "hospital". Denies SI, HI, AVH and pain when assessed. Presents restless, fidgety, irritable bilateral hand tremors with avertive eye contact, tangential speech with c/o tactile stimulation when assessed "I feel injections going into my skin right now, don't do that".  Emotional support, reassurance and encouragement offered to pt this shift. Q 15 minutes safety checks maintained without outburst. Medications given with verbal education and effects monitored. Repeat EKG done as ordered with same result "Wolff-Parkinson-White" with QTC at 404/505. Dr. Leverne Humbles notified immediately. Fluids encouraged and offered to pt.  Pt remains safe on unit. Denies concerns at this time. Tolerated lunch and fluids well.

## 2020-09-14 NOTE — Progress Notes (Signed)
Adult Psychoeducational Group Note  Date:  09/14/2020 Time:  8:53 PM  Group Topic/Focus:  Wrap-Up Group:   The focus of this group is to help patients review their daily goal of treatment and discuss progress on daily workbooks.  Participation Level:  Did Not Attend  Participation Quality:  Did Not Attend  Affect:  Did Not Attend  Cognitive:  Did Not Attend  Insight: None  Engagement in Group:  Did Not Attend  Modes of Intervention:  Did Not Attend  Additional Comments:  Pt did not attend evening wrap up group tonight.  Candy Sledge 09/14/2020, 8:53 PM

## 2020-09-14 NOTE — Progress Notes (Signed)
Pt can be heard in the room talking loudly to himself. Sitting outside pt door to assist with redirecting him to stay in his room.

## 2020-09-14 NOTE — Progress Notes (Signed)
Pt is now laying in bed. Pt still loudly talking to others in his room who are not there.

## 2020-09-14 NOTE — Progress Notes (Signed)
Did not attend group 

## 2020-09-14 NOTE — Consult Note (Signed)
EKG reviewed. WPW pattern without h/o tachycardia is a low risk. May avoid medications with prolonged QTc-up to 480 sec acceptable. May see EP doctor for possible ablation in near future. Discussed care with Dr. Damita Dunnings  Dixie Dials, MD 09/14/2020 4:12 PM

## 2020-09-15 DIAGNOSIS — F23 Brief psychotic disorder: Secondary | ICD-10-CM | POA: Diagnosis not present

## 2020-09-15 LAB — LIPID PANEL
Cholesterol: 167 mg/dL (ref 0–200)
HDL: 54 mg/dL (ref >40)
LDL Cholesterol: 103 mg/dL — ABNORMAL HIGH (ref 0–99)
Total CHOL/HDL Ratio: 3.1 RATIO
Triglycerides: 48 mg/dL (ref <150)
VLDL: 10 mg/dL (ref 0–40)

## 2020-09-15 LAB — TSH: TSH: 1.649 u[IU]/mL (ref 0.350–4.500)

## 2020-09-15 LAB — HEMOGLOBIN A1C
Hgb A1c MFr Bld: 5.1 % (ref 4.8–5.6)
Mean Plasma Glucose: 99.67 mg/dL

## 2020-09-15 MED ORDER — BENZONATATE 100 MG PO CAPS: 100.0000 mg | ORAL_CAPSULE | Freq: Three times a day (TID) | ORAL | Status: DC | PRN

## 2020-09-15 MED ORDER — NICOTINE POLACRILEX 2 MG MT GUM
2.0000 mg | CHEWING_GUM | OROMUCOSAL | Status: DC | PRN
  Administered 2020-09-15 – 2020-09-20 (×9): 2 mg via ORAL
  Filled 2020-09-15 (×5): qty 1

## 2020-09-15 NOTE — Progress Notes (Signed)
Adult Psychoeducational Group Note  Date:  09/15/2020 Time:  10:25 PM  Group Topic/Focus:  Wrap-Up Group:   The focus of this group is to help patients review their daily goal of treatment and discuss progress on daily workbooks.  Participation Level:  Did Not Attend  Participation Quality:  Did Not Attend  Affect:  Did Not Attend  Cognitive:  Did Not Attend  Insight: None  Engagement in Group:  Did Not Attend  Modes of Intervention:  Did Not Attend  Additional Comments:  Pt did not attend evening wrap up group tonight.  Candy Sledge 09/15/2020, 10:25 PM

## 2020-09-15 NOTE — BHH Group Notes (Signed)
[facility] LCSW Group Therapy  09/15/2020 3:21 PM   The focus of this group was to explore self compassion, awareness, and ways to practice self-compassion in daily life.   Type of Therapy:  Group Therapy  Participation Level:  Did Not Attend   Summary of Progress/Problems: Patient was invited by CSW to attend group. Patient acknowledged CSW, however was not in attendance.   Rasheema Truluck A Jacqulin Brandenburger 09/15/2020, 3:21 PM
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
## 2020-09-15 NOTE — BHH Group Notes (Signed)
The focus of this group is to help patients establish daily goals to achieve during treatment and discuss how the patient can incorporate goal setting into their daily lives to aide in recovery.  Pt did not attend group 

## 2020-09-15 NOTE — Progress Notes (Signed)
   09/15/20 2100  Psych Admission Type (Psych Patients Only)  Admission Status Involuntary  Psychosocial Assessment  Patient Complaints Anxiety  Eye Contact Brief  Facial Expression Flat  Affect Appropriate to circumstance  Speech Tangential  Interaction Forwards little;Guarded  Motor Activity Fidgety  Appearance/Hygiene Disheveled;Body odor  Behavior Characteristics Cooperative  Mood Anxious;Depressed  Thought Process  Coherency WDL  Content WDL  Delusions WDL  Perception WDL  Hallucination None reported or observed  Judgment Poor  Confusion Mild  Danger to Self  Current suicidal ideation? Denies  Danger to Others  Danger to Others None reported or observed

## 2020-09-15 NOTE — Progress Notes (Signed)
°   09/15/20 0748  Vital Signs  Temp 97.6 F (36.4 C)  Temp Source Oral  Pulse Rate (!) 108  BP (!) 120/95  BP Method Automatic   D:Patient denies SI/HI/AVH. Pt. Rated anxiety 2/10 and denied depression. Pt. Isolated in room. A:  Patient took scheduled medicine.  Support and encouragement provided Routine safety checks conducted every 15 minutes. Patient  Informed to notify staff with any concerns.   R: Safety maintained.

## 2020-09-15 NOTE — Progress Notes (Signed)
Recreation Therapy Notes  Date: 12.13.21 Time: 1005 Location: 500 Hall Dayroom   Group Topic: Communication, Team Building, Problem Solving  Goal Area(s) Addresses:  Patient will effectively work with peer towards shared goal.  Patient will identify skills used to make activity successful.  Patient will share challenges and verbalize solution-driven approaches used. Patient will identify how skills used during activity can be used to reach post d/c goals.   Intervention: STEM Activity   Activity: [employer]. Patients were provided the following materials: 2 drinking straws, 5 rubber bands, 5 paper clips, 2 index cards and 2 drinking cups. Using the provided materials patients were asked to build a launching mechanism to launch a ping pong ball across the room, approximately 10 feet. Patients were divided into teams of 3-5. Instructions required all materials be incorporated into the device, functionality of items left to the peer group's discretion.  Education: Education officer, community, Environmental health practitioner, Product/process development scientist, Dentist.   Education Outcome: Acknowledges education/In group clarification offered/Needs additional education.   Clinical Observations/Feedback: Pt did not attend group.    Victorino Sparrow, LRT/CTRS         Victorino Sparrow A 09/15/2020 12:21 PM

## 2020-09-16 DIAGNOSIS — F23 Brief psychotic disorder: Secondary | ICD-10-CM | POA: Diagnosis not present

## 2020-09-16 MED ORDER — DIVALPROEX SODIUM ER 500 MG PO TB24
500.0000 mg | ORAL_TABLET | Freq: Every day | ORAL | Status: DC
  Administered 2020-09-16: 21:00:00 500 mg via ORAL
  Filled 2020-09-16 (×2): qty 1

## 2020-09-16 MED ORDER — OLANZAPINE 10 MG PO TABS
10.0000 mg | ORAL_TABLET | Freq: Every day | ORAL | Status: DC
  Administered 2020-09-16 – 2020-09-17 (×2): 10 mg via ORAL
  Filled 2020-09-16 (×3): qty 1

## 2020-09-16 MED ORDER — OLANZAPINE 2.5 MG PO TABS
2.5000 mg | ORAL_TABLET | ORAL | Status: AC
  Administered 2020-09-16: 10:00:00 2.5 mg via ORAL
  Filled 2020-09-16: qty 1

## 2020-09-16 MED ORDER — OLANZAPINE 5 MG PO TBDP
15.0000 mg | ORAL_TABLET | Freq: Every day | ORAL | Status: DC
  Administered 2020-09-16: 21:00:00 15 mg via ORAL
  Filled 2020-09-16 (×2): qty 1

## 2020-09-16 NOTE — Plan of Care (Signed)
  Problem: Education: Goal: Knowledge of [facility] Education information/materials will improve Outcome: Progressing Goal: Emotional status will improve Outcome: Progressing Goal: Mental status will improve Outcome: Progressing   

## 2020-09-16 NOTE — Progress Notes (Signed)
Recreation Therapy Notes  INPATIENT RECREATION THERAPY ASSESSMENT  Patient Details Name: Edward [name] MRN: [id number] DOB: 2001/08/05 Today's Date: 09/16/2020       Information Obtained From: Chart Review  Reason for Admission (Per Patient): Substance Abuse,Other (Comments) (Hallucinations)  Patient Stressors:  (None)  Coping Skills:   Substance Abuse  Leisure Interests (2+):  Sports - Cytogeneticist - Other (Comment) [facility])  [place] of Residence:  [place]  Patient Identified Areas of Improvement:  Better self  Patient Goal for Hospitalization:  None  Current SI (including self-harm):     Current HI:     Current AVH:    Staff Intervention Plan: Group Attendance,Collaborate with Interdisciplinary Treatment Team  Consent to Intern Participation: N/A   Victorino Sparrow, LRT/CTRS  Victorino Sparrow A 09/16/2020, 12:41 PM

## 2020-09-16 NOTE — BHH Group Notes (Signed)
Adult Psychoeducational Group Note  Date:  09/16/2020 Time:  10:58 AM  Group Topic/Focus:  Goals Group:   The focus of this group is to help patients establish daily goals to achieve during treatment and discuss how the patient can incorporate goal setting into their daily lives to aide in recovery.   Additional Comments:  Pt did not attend morning goals group and was visibly angry this morning.    Edward [name] Been 09/16/2020, 10:58 AM

## 2020-09-16 NOTE — Progress Notes (Signed)
[facility] MD Progress Note  09/16/2020 11:03 AM Edward [name]  MRN:  [id number] Subjective:  Patient reports that he just got off the phone with his father. Patient reports that he is very worried that staff has taken the money out of his bag. Patient reports that his father was able to calm him down and make him realize that this was very unlikely. Of note writer noticed patient on the phone with his father earlier and patient was cussing into the phone and very rapidly telling his father that he wanted him to get him out and that "this place" was making his brain feel weird and he did not feel like himself.  Patient reports that he did have nightmares again last night but was able to wake up and realize that he was out of his dream and remained in his bed. Patient reports that he did have more trouble sleeping last night but he reports that this may be because he spent most of his time sleeping during the day and he will try to not sleep during the day today. Patient continues to report that his appetite is fine. Of note writer noticed that patient room had trash strewn across the floor and patient reported that the room had looked worse and he cleaned up a bit, but there were still many empty food and drink trays around the room and in the floor.  Patient noted his mood was 8/10 today the same as yesterday.  Writer spoke with father again today to update father on diagnosis and medication management. Father reports that it is hard for him to accept that this is not substance induced but he does acknowledge that this episode has been different from the past psychotic events in that the patient had hallucinations this time. Father remains open to helping his son get treatment and stay on his medications and provide support. Principal Problem: Schizophrenia spectrum disorder with psychotic disorder type not yet determined ([place]) Diagnosis: Principal Problem:   Schizophrenia spectrum disorder with psychotic  disorder type not yet determined ([place]) Active Problems:   Marijuana abuse   Benzodiazepine abuse ([place])  Total Time spent with patient: 20 minutes  Past Psychiatric History: See H&P  Past Medical History:  Past Medical History:  Diagnosis Date  . ADHD (attention deficit hyperactivity disorder)     Past Surgical History:  Procedure Laterality Date  . tubes in ears     Family History: History reviewed. No pertinent family history. Family Psychiatric  History: See H&P Social History:  Social History   Substance and Sexual Activity  Alcohol Use Not Currently   Comment: occ     Social History   Substance and Sexual Activity  Drug Use Yes  . Frequency: 2.0 times per week  . Types: Marijuana    Social History   Socioeconomic History  . Marital status: Single    Spouse name: Not on file  . Number of children: Not on file  . Years of education: Not on file  . Highest education level: Not on file  Occupational History  . Not on file  Tobacco Use  . Smoking status: Current Every Day Smoker    Packs/day: 0.50    Types: Cigarettes  . Smokeless tobacco: Never Used  Vaping Use  . Vaping Use: Some days  Substance and Sexual Activity  . Alcohol use: Not Currently    Comment: occ  . Drug use: Yes    Frequency: 2.0 times per week  Types: Marijuana  . Sexual activity: Not on file  Other Topics Concern  . Not on file  Social History Narrative  . Not on file   Social Determinants of Health   Financial Resource Strain: Not on file  Food Insecurity: Not on file  Transportation Needs: Not on file  Physical Activity: Not on file  Stress: Not on file  Social Connections: Not on file   Additional Social History:                         Sleep: Poor  Appetite:  Good  Current Medications: Current Facility-Administered Medications  Medication Dose Route Frequency Provider Last Rate Last Admin  . acetaminophen (TYLENOL) tablet 650 mg  650 mg Oral Q6H PRN  Connye Burkitt, NP      . alum & mag hydroxide-simeth (MAALOX/MYLANTA) 200-200-20 MG/5ML suspension 30 mL  30 mL Oral Q4H PRN Connye Burkitt, NP      . benzonatate (TESSALON) capsule 100 mg  100 mg Oral TID PRN Sharma Covert, MD      . hydrOXYzine (ATARAX/VISTARIL) tablet 25 mg  25 mg Oral TID PRN Connye Burkitt, NP   25 mg at 09/15/20 2132  . LORazepam (ATIVAN) tablet 1 mg  1 mg Oral Daily PRN Connye Burkitt, NP   1 mg at 09/14/20 0220   Or  . LORazepam (ATIVAN) injection 1 mg  1 mg Intramuscular Daily PRN Connye Burkitt, NP      . magnesium hydroxide (MILK OF MAGNESIA) suspension 30 mL  30 mL Oral Daily PRN Connye Burkitt, NP      . nicotine polacrilex (NICORETTE) gum 2 mg  2 mg Oral PRN Lindon Romp A, NP   2 mg at 09/15/20 2148  . OLANZapine (ZYPREXA) tablet 10 mg  10 mg Oral Daily Sharma Covert, MD   10 mg at 09/16/20 1012  . OLANZapine zydis (ZYPREXA) disintegrating tablet 10 mg  10 mg Oral BID PRN Connye Burkitt, NP   10 mg at 09/13/20 2156  . OLANZapine zydis (ZYPREXA) disintegrating tablet 15 mg  15 mg Oral QHS Sharma Covert, MD      . traZODone (DESYREL) tablet 50 mg  50 mg Oral QHS PRN Freida Busman, MD   50 mg at 09/15/20 2132  . ziprasidone (GEODON) injection 20 mg  20 mg Intramuscular Daily PRN Connye Burkitt, NP   20 mg at 09/14/20 1607    Lab Results:  Results for orders placed or performed during the hospital encounter of 09/11/20 (from the past 48 hour(s))  TSH     Status: None   Collection Time: 09/15/20  6:04 PM  Result Value Ref Range   TSH 1.649 0.350 - 4.500 uIU/mL    Comment: Performed by a 3rd Generation assay with a functional sensitivity of <=0.01 uIU/mL. Performed at [facility], [place] [address], [place], [place] [address]   Hemoglobin A1c     Status: None   Collection Time: 09/15/20  6:04 PM  Result Value Ref Range   Hgb A1c MFr Bld 5.1 4.8 - 5.6 %    Comment: (NOTE) Pre diabetes:          5.7%-6.4%  Diabetes:               >6.4%  Glycemic control for   <7.0% adults with diabetes    Mean Plasma Glucose 99.67 mg/dL  Comment: Performed at [facility], [place] [address], [place], [place] [address]  Lipid panel     Status: Abnormal   Collection Time: 09/15/20  6:04 PM  Result Value Ref Range   Cholesterol 167 0 - 200 mg/dL   Triglycerides 48 <150 mg/dL   HDL 54 >40 mg/dL   Total CHOL/HDL Ratio 3.1 RATIO   VLDL 10 0 - 40 mg/dL   LDL Cholesterol 103 (H) 0 - 99 mg/dL    Comment:        Total Cholesterol/HDL:CHD Risk Coronary Heart Disease Risk Table                     Men   Women  1/2 Average Risk   3.4   3.3  Average Risk       5.0   4.4  2 X Average Risk   9.6   7.1  3 X Average Risk  23.4   11.0        Use the calculated Patient Ratio above and the CHD Risk Table to determine the patient's CHD Risk.        ATP III CLASSIFICATION (LDL):  <100     mg/dL   Optimal  100-129  mg/dL   Near or Above                    Optimal  130-159  mg/dL   Borderline  160-189  mg/dL   High  >190     mg/dL   Very High Performed at [facility] [address] [place], [place] [address]     Blood Alcohol level:  Lab Results  Component Value Date   ETH <10 09/11/2020   ETH <10 42/59/5638    Metabolic Disorder Labs: Lab Results  Component Value Date   HGBA1C 5.1 09/15/2020   MPG 99.67 09/15/2020   No results found for: PROLACTIN Lab Results  Component Value Date   CHOL 167 09/15/2020   TRIG 48 09/15/2020   HDL 54 09/15/2020   CHOLHDL 3.1 09/15/2020   VLDL 10 09/15/2020   LDLCALC 103 (H) 09/15/2020    Physical Findings: AIMS: Facial and Oral Movements Muscles of Facial Expression: None, normal Lips and Perioral Area: None, normal Jaw: None, normal Tongue: None, normal,Extremity Movements Upper (arms, wrists, hands, fingers): None, normal Lower (legs, knees, ankles, toes): None, normal, Trunk Movements Neck, shoulders, hips: None, normal, Overall Severity Severity  of abnormal movements (highest score from questions above): None, normal Incapacitation due to abnormal movements: None, normal Patient's awareness of abnormal movements (rate only patient's report): No Awareness, Dental Status Current problems with teeth and/or dentures?: No Does patient usually wear dentures?: No  CIWA:    COWS:     Musculoskeletal: Strength & Muscle Tone: within normal limits Gait & Station: normal Patient leans: N/A  Psychiatric Specialty Exam: Physical Exam HENT:     Head: Normocephalic and atraumatic.  Pulmonary:     Effort: Pulmonary effort is normal.  Neurological:     Mental Status: He is alert.     Review of Systems  Cardiovascular: Negative for chest pain.  Gastrointestinal: Negative for abdominal pain.  Neurological: Positive for headaches.    Blood pressure (!) 120/95, pulse (!) 108, temperature 97.6 F (36.4 C), temperature source Oral, resp. rate 18, height 5\' 9"  (1.753 m), weight 60.3 kg, SpO2 98 %.Body mass index is 19.64 kg/m.  General Appearance: Bizarre  Eye Contact:  Minimal  Speech:  Pressured  Volume:  Normal  Mood:  Euthymic  Affect:  Blunt  Thought Process:  Linear  Orientation:  NA  Thought Content:  Paranoid Ideation  Suicidal Thoughts:  No  Homicidal Thoughts:  No  Memory:  NA  Judgement:  Impaired  Insight:  Lacking  Psychomotor Activity:  Normal  Concentration:  Concentration: Fair  Recall:  NA  Fund of Knowledge:  Fair  Language:  Fair  Akathisia:  No  Handed:  Right  AIMS (if indicated):     Assets:  Housing Leisure Time  ADL's:  Intact  Cognition:  Impaired,  Mild  Sleep:  Number of Hours: 1.75     Treatment Plan Summary: Daily contact with patient to assess and evaluate symptoms and progress in treatment  Mr. Zagal is a 19yo patient who continues to endorse bizarre behavior and is being treated for Acute Psychosis.Patient was more paranoid this AM however, his paranoia was no longer about being stuck  with needles but he was worried that staff had taken his money. Patient was actually able to give blood for lab work. Lipid panel is not concerning, A1c WNL, and TSH WNL. Patient was on the phone this AM with rapid speech and more foul language than usual and reminiscent of when he was admitted. Patient definitely requires increase in his antipsychotic medications. Will need to monitor patient for increase drowsiness and attempt to keep patient out of his bed during the day. Patient has not ingested THC in at least 11 days and continues to endorse symptoms of psychosis and his paranoia is changing further suggesting patient has Schizophrenia. Father has been updated on diagnosis.   Schizophrenia Spectrum disorder unspecified type, likely paranoia - Zyprexa 10 AM and 15mg  QHS - CT Head when patient no longer a flight risk  PRN -Tylenol 650mg  q6h, pain -Maalox 5ml q4h, constipation -Atarax 25mg  TID, anxiety - Zyprexa 10mg  PRN, agitation - Ativan 1mg  PRN -Geodon 20mg , agiation -Milk of Mag 22mL, constipation -Trazodone 50mg  QHS, insomnia  PGY-1 Freida Busman, MD 09/16/2020, 11:03 AM

## 2020-09-16 NOTE — [facility] Suicide Risk Assessment (Signed)
[place] INPATIENT:  Family/Significant Other Suicide Prevention Education    Suicide Prevention Education: Education Completed; mother, Stonewall Doss [phone],  has been identified by the patient as the family member/significant other with whom the patient will be residing, and identified as the person(s) who will aid the patient in the event of a mental health crisis (suicidal ideations/suicide attempt).  With written consent from the patient, the family member/significant other has been provided the following suicide prevention education, prior to the and/or following the discharge of the patient.  The suicide prevention education provided includes the following:  Suicide risk factors  Suicide prevention and interventions  National Suicide Hotline telephone number  [facility] assessment telephone number  [facility] Emergency Assistance [place] and/or Residential Mobile Crisis Unit telephone number   Request made of family/significant other to:  Remove weapons (e.g., guns, rifles, knives), all items previously/currently identified as safety concern.    Remove drugs/medications (over-the-counter, prescriptions, illicit drugs), all items previously/currently identified as a safety concern.   The family member/significant other verbalizes understanding of the suicide prevention education information provided.  The family member/significant other agrees to remove the items of safety concern listed above.  CSW spoke with this patients mother who reported that this patient had come to stay with her this past Friday due to his father kicking him out. Per mother this patient and his father do not get along and he gets kicked out often. She stated that both she and his father have no drug tolerance in their homes and if this patient uses or brings drugs in their home he is automatically kicked out. Patients mother reported that she would like this patient to return to  [facility] or to another rehab facility. Patients mother stated if he went to treatment he would be able to stay with her at discharge. Patients mother voiced that she thought it would be a bad idea for this patient to return to live with his father. Patients mother also shared that this patient will have to attend his outpatient appointments in order to stay with her or his father.   Darletta Moll MSW, LCSW Clincal Social Worker  [facility]

## 2020-09-16 NOTE — Progress Notes (Signed)
Progress note   09/16/20 1000  Psych Admission Type (Psych Patients Only)  Admission Status Involuntary  Psychosocial Assessment  Patient Complaints Anxiety  Eye Contact Fair  Facial Expression Animated;Anxious  Affect Anxious  Speech Chiropractor;Restless  Appearance/Hygiene Improved  Behavior Characteristics Cooperative;Appropriate to situation;Anxious  Mood Anxious;Pleasant  Thought Horticulturist, commercial of ideas  Content Confabulation  Delusions None reported or observed  Perception WDL  Hallucination None reported or observed  Judgment Poor  Confusion None  Danger to Self  Current suicidal ideation? Denies  Danger to Others  Danger to Others None reported or observed

## 2020-09-17 DIAGNOSIS — F29 Unspecified psychosis not due to a substance or known physiological condition: Secondary | ICD-10-CM

## 2020-09-17 MED ORDER — DOXYCYCLINE HYCLATE 100 MG PO TABS
100.0000 mg | ORAL_TABLET | Freq: Two times a day (BID) | ORAL | Status: DC
  Administered 2020-09-17 – 2020-09-20 (×6): 100 mg via ORAL
  Filled 2020-09-17 (×10): qty 1

## 2020-09-17 MED ORDER — OLANZAPINE 10 MG PO TBDP
20.0000 mg | ORAL_TABLET | Freq: Every day | ORAL | Status: DC
  Administered 2020-09-17 – 2020-09-19 (×3): 20 mg via ORAL
  Filled 2020-09-17 (×5): qty 2

## 2020-09-17 MED ORDER — CEFTRIAXONE SODIUM 500 MG IJ SOLR
500.0000 mg | Freq: Once | INTRAMUSCULAR | Status: DC
  Filled 2020-09-17: qty 500

## 2020-09-17 MED ORDER — DIVALPROEX SODIUM ER 250 MG PO TB24
250.0000 mg | ORAL_TABLET | Freq: Every day | ORAL | Status: DC
  Administered 2020-09-17 – 2020-09-19 (×3): 250 mg via ORAL
  Filled 2020-09-17 (×5): qty 1

## 2020-09-17 MED ORDER — CEFTRIAXONE SODIUM 1 G IJ SOLR
500.0000 mg | Freq: Once | INTRAMUSCULAR | Status: AC
  Administered 2020-09-17: 18:00:00 500 mg via INTRAMUSCULAR
  Filled 2020-09-17: qty 10

## 2020-09-17 NOTE — Progress Notes (Signed)
DAR NOTE: Patient presents with flat affect and depressed mood.  Denies suicidal thoughts, auditory and visual hallucinations.  Patient appears sedated and sleepy throughout the day.  Routine safety checks maintained.  Medications given as prescribed.  Support and encouragement offered as needed.  Attended group and participated.  Patient is safe on the unit.

## 2020-09-17 NOTE — Tx Team (Signed)
Interdisciplinary Treatment and Diagnostic Plan Update  09/17/2020 Time of Session: 9:15am Edward [name] MRN: [id number]  Principal Diagnosis: Schizophrenia spectrum disorder with psychotic disorder type not yet determined [facility])  Secondary Diagnoses: Principal Problem:   Schizophrenia spectrum disorder with psychotic disorder type not yet determined ([place]) Active Problems:   Marijuana abuse   Benzodiazepine abuse ([place])   Current Medications:  Current Facility-Administered Medications  Medication Dose Route Frequency Provider Last Rate Last Admin  . acetaminophen (TYLENOL) tablet 650 mg  650 mg Oral Q6H PRN Connye Burkitt, NP      . alum & mag hydroxide-simeth (MAALOX/MYLANTA) 200-200-20 MG/5ML suspension 30 mL  30 mL Oral Q4H PRN Connye Burkitt, NP      . benzonatate (TESSALON) capsule 100 mg  100 mg Oral TID PRN Sharma Covert, MD      . divalproex (DEPAKOTE ER) 24 hr tablet 500 mg  500 mg Oral QHS Damita Dunnings B, MD   500 mg at 09/16/20 2032  . hydrOXYzine (ATARAX/VISTARIL) tablet 25 mg  25 mg Oral TID PRN Connye Burkitt, NP   25 mg at 09/15/20 2132  . LORazepam (ATIVAN) tablet 1 mg  1 mg Oral Daily PRN Connye Burkitt, NP   1 mg at 09/14/20 0220   Or  . LORazepam (ATIVAN) injection 1 mg  1 mg Intramuscular Daily PRN Connye Burkitt, NP      . magnesium hydroxide (MILK OF MAGNESIA) suspension 30 mL  30 mL Oral Daily PRN Connye Burkitt, NP      . nicotine polacrilex (NICORETTE) gum 2 mg  2 mg Oral PRN Rozetta Nunnery, NP   2 mg at 09/16/20 2034  . OLANZapine (ZYPREXA) tablet 10 mg  10 mg Oral Daily Sharma Covert, MD   10 mg at 09/17/20 0809  . OLANZapine zydis (ZYPREXA) disintegrating tablet 10 mg  10 mg Oral BID PRN Connye Burkitt, NP   10 mg at 09/13/20 2156  . OLANZapine zydis (ZYPREXA) disintegrating tablet 15 mg  15 mg Oral QHS Sharma Covert, MD   15 mg at 09/16/20 2033  . traZODone (DESYREL) tablet 50 mg  50 mg Oral QHS PRN Freida Busman, MD   50 mg at 09/15/20 2132   . ziprasidone (GEODON) injection 20 mg  20 mg Intramuscular Daily PRN Connye Burkitt, NP   20 mg at 09/14/20 0219   PTA Medications: No medications prior to admission.    Patient Stressors: Marital or family conflict Substance abuse  Patient Strengths: General fund of knowledge Physical Health  Treatment Modalities: Medication Management, Group therapy, Case management,  1 to 1 session with clinician, Psychoeducation, Recreational therapy.   Physician Treatment Plan for Primary Diagnosis: Schizophrenia spectrum disorder with psychotic disorder type not yet determined ([place]) Long Term Goal(s): Improvement in symptoms so as ready for discharge Improvement in symptoms so as ready for discharge   Short Term Goals: Ability to identify changes in lifestyle to reduce recurrence of condition will improve Ability to verbalize feelings will improve Ability to identify and develop effective coping behaviors will improve Ability to maintain clinical measurements within normal limits will improve Compliance with prescribed medications will improve Ability to identify triggers associated with substance abuse/mental health issues will improve Ability to identify changes in lifestyle to reduce recurrence of condition will improve Ability to verbalize feelings will improve Ability to disclose and discuss suicidal ideas Compliance with prescribed medications will improve Ability to identify triggers associated  with substance abuse/mental health issues will improve  Medication Management: Evaluate patient's response, side effects, and tolerance of medication regimen.  Therapeutic Interventions: 1 to 1 sessions, Unit Group sessions and Medication administration.  Evaluation of Outcomes: Not Met  Physician Treatment Plan for Secondary Diagnosis: Principal Problem:   Schizophrenia spectrum disorder with psychotic disorder type not yet determined ([place]) Active Problems:   Marijuana abuse    Benzodiazepine abuse ([place])  Long Term Goal(s): Improvement in symptoms so as ready for discharge Improvement in symptoms so as ready for discharge   Short Term Goals: Ability to identify changes in lifestyle to reduce recurrence of condition will improve Ability to verbalize feelings will improve Ability to identify and develop effective coping behaviors will improve Ability to maintain clinical measurements within normal limits will improve Compliance with prescribed medications will improve Ability to identify triggers associated with substance abuse/mental health issues will improve Ability to identify changes in lifestyle to reduce recurrence of condition will improve Ability to verbalize feelings will improve Ability to disclose and discuss suicidal ideas Compliance with prescribed medications will improve Ability to identify triggers associated with substance abuse/mental health issues will improve     Medication Management: Evaluate patient's response, side effects, and tolerance of medication regimen.  Therapeutic Interventions: 1 to 1 sessions, Unit Group sessions and Medication administration.  Evaluation of Outcomes: Not Met   RN Treatment Plan for Primary Diagnosis: Schizophrenia spectrum disorder with psychotic disorder type not yet determined ([place]) Long Term Goal(s): Knowledge of disease and therapeutic regimen to maintain health will improve  Short Term Goals: Ability to remain free from injury will improve, Ability to verbalize frustration and anger appropriately will improve, Ability to identify and develop effective coping behaviors will improve and Compliance with prescribed medications will improve  Medication Management: RN will administer medications as ordered by provider, will assess and evaluate patient's response and provide education to patient for prescribed medication. RN will report any adverse and/or side effects to prescribing provider.  Therapeutic  Interventions: 1 on 1 counseling sessions, Psychoeducation, Medication administration, Evaluate responses to treatment, Monitor vital signs and CBGs as ordered, Perform/monitor CIWA, COWS, AIMS and Fall Risk screenings as ordered, Perform wound care treatments as ordered.  Evaluation of Outcomes: Not Met   LCSW Treatment Plan for Primary Diagnosis: Schizophrenia spectrum disorder with psychotic disorder type not yet determined ([place]) Long Term Goal(s): Safe transition to appropriate next level of care at discharge, Engage patient in therapeutic group addressing interpersonal concerns.  Short Term Goals: Engage patient in aftercare planning with referrals and resources, Increase social support, Increase ability to appropriately verbalize feelings, Identify triggers associated with mental health/substance abuse issues and Increase skills for wellness and recovery  Therapeutic Interventions: Assess for all discharge needs, 1 to 1 time with Social worker, Explore available resources and support systems, Assess for adequacy in community support network, Educate family and significant other(s) on suicide prevention, Complete Psychosocial Assessment, Interpersonal group therapy.  Evaluation of Outcomes: Not Met   Progress in Treatment: Attending groups: No. Participating in groups: No. Taking medication as prescribed: Yes. Toleration medication: Yes. Family/Significant other contact made: Yes, individual(s) contacted:  Mother Patient understands diagnosis: No. Discussing patient identified problems/goals with staff: Yes. Medical problems stabilized or resolved: Yes. Denies suicidal/homicidal ideation: Yes. Issues/concerns per patient self-inventory: No.   New problem(s) identified: No, Describe:  none  New Short Term/Long Term Goal(s): medication stabilization, elimination of SI thoughts, development of comprehensive mental wellness plan.   Patient Goals: "To get sleep"  Discharge Plan or  Barriers: Patient has agreed to medication management and therapy. Per father pt is able to return to live with him post discharge. Per mother she is interested in this patient going to longer term substance use treatment prior to returning home.  Reason for Continuation of Hospitalization: Delusions  Hallucinations Medication stabilization  Estimated Length of Stay: 3-5 days   Attendees: Patient:  09/12/2020   Physician:  09/12/2020   Nursing:  09/12/2020   RN Care Manager: 09/12/2020   Social Worker: Darletta Moll, LCSW 09/12/2020   Recreational Therapist:  09/12/2020   Other:  09/12/2020   Other:  09/12/2020   Other: 09/12/2020      Scribe for Treatment Team: Vassie Moselle, LCSW 09/17/2020 11:17 AM

## 2020-09-17 NOTE — Progress Notes (Addendum)
   09/17/20 2158  Psych Admission Type (Psych Patients Only)  Admission Status Involuntary  Psychosocial Assessment  Patient Complaints Anxiety  Eye Contact Fair  Facial Expression Animated;Anxious  Affect Anxious  Speech International aid/development worker;Restless  Appearance/Hygiene Improved  Behavior Characteristics Cooperative;Appropriate to situation  Mood Anxious;Pleasant  Thought Process  Coherency Circumstantial  Content Blaming self  Delusions None reported or observed  Perception WDL  Hallucination None reported or observed  Judgment WDL (improved)  Confusion None  Danger to Self  Current suicidal ideation? Denies  Danger to Others  Danger to Others None reported or observed   Pt denies SI, HI, AVH and pain. Pt showing insight into his condition. "I talked to my dad and he said that I may have to get some more shots to help me. I can see now that the medication is helping me and I want help. I didn't tell you guys at first but when I was yelling I was having nightmares and when I would wake up, they would seem real. I should have told you before and my dad says I should let you know when that happens. I don't like needles but I know I need the medicine."   Pt's mother has been calling asking about pt condition. Pt stated this evening that he will not place her name on his consent list and does not want to speak with her.

## 2020-09-17 NOTE — Progress Notes (Addendum)
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  Objective: Patient is seen and examined.  Patient is a 19 year old male with the above-stated past psychiatric history who is seen in follow-up.  He was also seen by the psychiatric resident today.  Unfortunately he did not sleep last night.  He was up most the night.  He is very sedated today.  Yesterday he was significantly paranoid and agitated.  He was calling and was profane and threatening to his family.  Review of the nursing notes from last night revealed that the patient was thought to be anxious, pressured, fidgety and restless.  There was no evidence of auditory or visual hallucinations.  He had denied any suicidal thinking yesterday.  His blood pressure is 120/95, pulse was 108.  He is afebrile.  He essentially slept none last night.  No new laboratories.  EKG from 12/11 showed evidence of the Wolff-Parkinson-White syndrome.  Cardiology is familiar with the patient.  Recommendation was to avoid medications with prolonged QTc interval up to 480 seconds.  His QTC on that date was 420.  Mental status examination is limited today secondary to oversedation.  Principal Problem: Schizophrenia spectrum disorder with psychotic disorder type not yet determined ([place]) Diagnosis: Principal Problem:   Schizophrenia spectrum disorder with psychotic disorder type not yet determined ([place]) Active Problems:   Marijuana abuse   Benzodiazepine abuse ([place])  Total Time spent with patient: 20 minutes  Past Psychiatric History: See admission H&P  Past Medical History:  Past Medical History:  Diagnosis Date  . ADHD  (attention deficit hyperactivity disorder)     Past Surgical History:  Procedure Laterality Date  . tubes in ears     Family History: History reviewed. No pertinent family history. Family Psychiatric  History: See admission H&P Social History:  Social History   Substance and Sexual Activity  Alcohol Use Not Currently   Comment: occ     Social History   Substance and Sexual Activity  Drug Use Yes  . Frequency: 2.0 times per week  . Types: Marijuana    Social History   Socioeconomic History  . Marital status: Single    Spouse name: Not on file  . Number of children: Not on file  . Years of education: Not on file  . Highest education level: Not on file  Occupational History  . Not on file  Tobacco Use  . Smoking status: Current Every Day Smoker    Packs/day: 0.50    Types: Cigarettes  . Smokeless tobacco: Never Used  Vaping Use  . Vaping Use: Some days  Substance and Sexual Activity  . Alcohol use: Not Currently    Comment: occ  . Drug use: Yes    Frequency: 2.0 times per week    Types: Marijuana  . Sexual activity: Not on file  Other Topics Concern  . Not on file  Social History Narrative  . Not on file   Social Determinants of Health   Financial Resource Strain: Not on file  Food Insecurity: Not on file  Transportation Needs: Not on file  Physical Activity: Not on file  Stress: Not on file  Social Connections: Not on file   Additional Social History:  Sleep: Poor  Appetite:  Fair  Current Medications: Current Facility-Administered Medications  Medication Dose Route Frequency Provider Last Rate Last Admin  . acetaminophen (TYLENOL) tablet 650 mg  650 mg Oral Q6H PRN Connye Burkitt, NP      . alum & mag hydroxide-simeth (MAALOX/MYLANTA) 200-200-20 MG/5ML suspension 30 mL  30 mL Oral Q4H PRN Connye Burkitt, NP      . benzonatate (TESSALON) capsule 100 mg  100 mg Oral TID PRN Sharma Covert, MD      . divalproex  (DEPAKOTE ER) 24 hr tablet 250 mg  250 mg Oral QHS Sharma Covert, MD      . hydrOXYzine (ATARAX/VISTARIL) tablet 25 mg  25 mg Oral TID PRN Connye Burkitt, NP   25 mg at 09/15/20 2132  . LORazepam (ATIVAN) tablet 1 mg  1 mg Oral Daily PRN Connye Burkitt, NP   1 mg at 09/14/20 0220   Or  . LORazepam (ATIVAN) injection 1 mg  1 mg Intramuscular Daily PRN Connye Burkitt, NP      . magnesium hydroxide (MILK OF MAGNESIA) suspension 30 mL  30 mL Oral Daily PRN Connye Burkitt, NP      . nicotine polacrilex (NICORETTE) gum 2 mg  2 mg Oral PRN Rozetta Nunnery, NP   2 mg at 09/16/20 2034  . OLANZapine (ZYPREXA) tablet 10 mg  10 mg Oral Daily Sharma Covert, MD   10 mg at 09/17/20 0809  . OLANZapine zydis (ZYPREXA) disintegrating tablet 10 mg  10 mg Oral BID PRN Connye Burkitt, NP   10 mg at 09/13/20 2156  . OLANZapine zydis (ZYPREXA) disintegrating tablet 20 mg  20 mg Oral QHS Sharma Covert, MD      . traZODone (DESYREL) tablet 50 mg  50 mg Oral QHS PRN Freida Busman, MD   50 mg at 09/15/20 2132  . ziprasidone (GEODON) injection 20 mg  20 mg Intramuscular Daily PRN Connye Burkitt, NP   20 mg at 09/14/20 5747    Lab Results:  Results for orders placed or performed during the hospital encounter of 09/11/20 (from the past 48 hour(s))  TSH     Status: None   Collection Time: 09/15/20  6:04 PM  Result Value Ref Range   TSH 1.649 0.350 - 4.500 uIU/mL    Comment: Performed by a 3rd Generation assay with a functional sensitivity of <=0.01 uIU/mL. Performed at [facility], [place] [address], [place], [place] [address]   Hemoglobin A1c     Status: None   Collection Time: 09/15/20  6:04 PM  Result Value Ref Range   Hgb A1c MFr Bld 5.1 4.8 - 5.6 %    Comment: (NOTE) Pre diabetes:          5.7%-6.4%  Diabetes:              >6.4%  Glycemic control for   <7.0% adults with diabetes    Mean Plasma Glucose 99.67 mg/dL    Comment: Performed at [facility] [address], [place], [place] [address]  Lipid panel     Status: Abnormal   Collection Time: 09/15/20  6:04 PM  Result Value Ref Range   Cholesterol 167 0 - 200 mg/dL   Triglycerides 48 <150 mg/dL   HDL 54 >40 mg/dL   Total CHOL/HDL Ratio 3.1 RATIO   VLDL 10 0 - 40 mg/dL   LDL Cholesterol 103 (  H) 0 - 99 mg/dL    Comment:        Total Cholesterol/HDL:CHD Risk Coronary Heart Disease Risk Table                     Men   Women  1/2 Average Risk   3.4   3.3  Average Risk       5.0   4.4  2 X Average Risk   9.6   7.1  3 X Average Risk  23.4   11.0        Use the calculated Patient Ratio above and the CHD Risk Table to determine the patient's CHD Risk.        ATP III CLASSIFICATION (LDL):  <100     mg/dL   Optimal  100-129  mg/dL   Near or Above                    Optimal  130-159  mg/dL   Borderline  160-189  mg/dL   High  >190     mg/dL   Very High Performed at [facility] [address] [place], [place] [address]     Blood Alcohol level:  Lab Results  Component Value Date   ETH <10 09/11/2020   ETH <10 60/45/4098    Metabolic Disorder Labs: Lab Results  Component Value Date   HGBA1C 5.1 09/15/2020   MPG 99.67 09/15/2020   No results found for: PROLACTIN Lab Results  Component Value Date   CHOL 167 09/15/2020   TRIG 48 09/15/2020   HDL 54 09/15/2020   CHOLHDL 3.1 09/15/2020   VLDL 10 09/15/2020   LDLCALC 103 (H) 09/15/2020    Physical Findings: AIMS: Facial and Oral Movements Muscles of Facial Expression: None, normal Lips and Perioral Area: None, normal Jaw: None, normal Tongue: None, normal,Extremity Movements Upper (arms, wrists, hands, fingers): None, normal Lower (legs, knees, ankles, toes): None, normal, Trunk Movements Neck, shoulders, hips: None, normal, Overall Severity Severity of abnormal movements (highest score from questions above): None, normal Incapacitation due to abnormal movements: None, normal Patient's awareness of abnormal  movements (rate only patient's report): No Awareness, Dental Status Current problems with teeth and/or dentures?: No Does patient usually wear dentures?: No  CIWA:    COWS:     Musculoskeletal: Strength & Muscle Tone: within normal limits Gait & Station: shuffle Patient leans: N/A  Psychiatric Specialty Exam: Physical Exam Vitals and nursing note reviewed.  HENT:     Head: Normocephalic and atraumatic.  Pulmonary:     Effort: Pulmonary effort is normal.  Neurological:     General: No focal deficit present.     Mental Status: He is alert.     Review of Systems  Blood pressure (!) 120/95, pulse (!) 108, temperature 97.6 F (36.4 C), temperature source Oral, resp. rate 18, height 5\' 9"  (1.753 m), weight 60.3 kg, SpO2 98 %.Body mass index is 19.64 kg/m.  General Appearance: Disheveled  Eye Contact:  Minimal  Speech:  Slow  Volume:  Decreased  Mood:  Dysphoric and Sedated  Affect:  Flat  Thought Process:  Goal Directed and Descriptions of Associations: Circumstantial  Orientation:  Negative  Thought Content:  Delusions and Paranoid Ideation  Suicidal Thoughts:  No  Homicidal Thoughts:  No  Memory:  Immediate;   Poor Recent;   Poor Remote;   Poor  Judgement:  Intact  Insight:  Fair  Psychomotor Activity:  Decreased  Concentration:  Concentration: Poor and Attention Span: Poor  Recall:  Poor  Fund of Knowledge:  Poor  Language:  Fair  Akathisia:  Negative  Handed:  Right  AIMS (if indicated):     Assets:  Desire for Improvement Resilience  ADL's:  Impaired  Cognition:  WNL  Sleep:  Number of Hours: 1     Treatment Plan Summary: Daily contact with patient to assess and evaluate symptoms and progress in treatment, Medication management and Plan : Patient is seen and examined.  Patient is a 19 year old male with the above-stated past psychiatric history who is seen in follow-up.   Diagnosis: 1.  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  Wolff-Parkinson-White syndrome  Pertinent findings on examination today: 1.  Patient did not sleep at all last night, and is significantly sedated today. 2.  Mental status examination is limited secondary to his sedation. 3.  I have encouraged the patient to stay up during the day today to improve his sleep at night.  Plan: 1.  Decrease Depakote ER to 250 mg p.o. nightly for mood stability.  The reduction is secondary to perhaps a paradoxical reaction that may be keeping him awake. 2.  Increase Zyprexa to 20 mg p.o. nightly for psychosis. 3.  Stop daytime Zyprexa. 4.  Continue Zyprexa Zydis 10 mg p.o. twice daily as needed agitation. 5.  Continue trazodone 50 mg p.o. nightly as needed insomnia. 6.  Continue lorazepam 1 mg p.o. or IM every 6 hours as needed agitation. 7.  Continue Geodon 20 mg IM every 6 hours as needed agitation. 8.  Repeat EKG secondary to WPW. 9.  Disposition planning-in progress.  Sharma Covert, MD 09/17/2020, 12:58 PM   Addendum: [name] called the hospital today and went to speak with me.  He apparently is received a letter from the health department and his county showing that the patient has been diagnosed with chlamydia and wanted to make sure that he was treated for this.  The father stated that there was no evidence in the letter of any gonorrhea or syphilis.  We will go on and treat the infection as though he may be infected with more than chlamydia.

## 2020-09-17 NOTE — Progress Notes (Signed)
   09/16/20 2322  Psych Admission Type (Psych Patients Only)  Admission Status Involuntary  Psychosocial Assessment  Patient Complaints Anxiety  Eye Contact Fair  Facial Expression Animated;Anxious  Affect Anxious  Speech Chiropractor;Restless  Appearance/Hygiene Improved  Behavior Characteristics Cooperative  Mood Pleasant  Thought Process  Coherency Flight of ideas  Content Confabulation  Delusions None reported or observed  Perception WDL  Hallucination None reported or observed  Judgment Poor  Confusion None  Danger to Self  Current suicidal ideation? Denies  Danger to Others  Danger to Others None reported or observed

## 2020-09-18 NOTE — Progress Notes (Signed)
[facility] MD Progress Note  09/18/2020 1:45 PM Edward [name]  MRN:  [id number] Subjective:  Patient remains compliant on his medication. This AM patient reports feeling as he "caught up on sleep." Patient reports that he did not have nightmares for the first time in a few nights and that he is feeling like he can stay up and interact more on the unit today. Patient also reports that he cleaned his room up quite a bit and it is obvious to the writer that this is true. Patient reports that his appetite remains good and patient reports that he is feeling" good" today. Patient does not endorse AVH nor HI and contracts for safety.   Of note writer noticed that patient was less irritable this AM and seemed to follow the conversation better than usual.  Principal Problem: Schizophrenia spectrum disorder with psychotic disorder type not yet determined ([place]) Diagnosis: Principal Problem:   Schizophrenia spectrum disorder with psychotic disorder type not yet determined ([place]) Active Problems:   Marijuana abuse   Benzodiazepine abuse ([place])  Total Time spent with patient: 15 minutes  Past Psychiatric History: See H&P  Past Medical History:  Past Medical History:  Diagnosis Date  . ADHD (attention deficit hyperactivity disorder)     Past Surgical History:  Procedure Laterality Date  . tubes in ears     Family History: History reviewed. No pertinent family history. Family Psychiatric  History: See H&P Social History:  Social History   Substance and Sexual Activity  Alcohol Use Not Currently   Comment: occ     Social History   Substance and Sexual Activity  Drug Use Yes  . Frequency: 2.0 times per week  . Types: Marijuana    Social History   Socioeconomic History  . Marital status: Single    Spouse name: Not on file  . Number of children: Not on file  . Years of education: Not on file  . Highest education level: Not on file  Occupational History  . Not on file  Tobacco Use  . Smoking  status: Current Every Day Smoker    Packs/day: 0.50    Types: Cigarettes  . Smokeless tobacco: Never Used  Vaping Use  . Vaping Use: Some days  Substance and Sexual Activity  . Alcohol use: Not Currently    Comment: occ  . Drug use: Yes    Frequency: 2.0 times per week    Types: Marijuana  . Sexual activity: Not on file  Other Topics Concern  . Not on file  Social History Narrative  . Not on file   Social Determinants of Health   Financial Resource Strain: Not on file  Food Insecurity: Not on file  Transportation Needs: Not on file  Physical Activity: Not on file  Stress: Not on file  Social Connections: Not on file   Additional Social History:                         Sleep: Good  Appetite:  Good  Current Medications: Current Facility-Administered Medications  Medication Dose Route Frequency Provider Last Rate Last Admin  . acetaminophen (TYLENOL) tablet 650 mg  650 mg Oral Q6H PRN Connye Burkitt, NP   650 mg at 09/17/20 1822  . alum & mag hydroxide-simeth (MAALOX/MYLANTA) 200-200-20 MG/5ML suspension 30 mL  30 mL Oral Q4H PRN Connye Burkitt, NP      . benzonatate (TESSALON) capsule 100 mg  100 mg Oral TID PRN  Sharma Covert, MD      . divalproex (DEPAKOTE ER) 24 hr tablet 250 mg  250 mg Oral QHS Sharma Covert, MD   250 mg at 09/17/20 2056  . doxycycline (VIBRA-TABS) tablet 100 mg  100 mg Oral Q12H Sharma Covert, MD   100 mg at 09/18/20 2229  . hydrOXYzine (ATARAX/VISTARIL) tablet 25 mg  25 mg Oral TID PRN Connye Burkitt, NP   25 mg at 09/17/20 2056  . LORazepam (ATIVAN) tablet 1 mg  1 mg Oral Daily PRN Connye Burkitt, NP   1 mg at 09/14/20 0220   Or  . LORazepam (ATIVAN) injection 1 mg  1 mg Intramuscular Daily PRN Connye Burkitt, NP      . magnesium hydroxide (MILK OF MAGNESIA) suspension 30 mL  30 mL Oral Daily PRN Connye Burkitt, NP      . nicotine polacrilex (NICORETTE) gum 2 mg  2 mg Oral PRN Rozetta Nunnery, NP   2 mg at 09/16/20 2034  .  OLANZapine zydis (ZYPREXA) disintegrating tablet 10 mg  10 mg Oral BID PRN Connye Burkitt, NP   10 mg at 09/13/20 2156  . OLANZapine zydis (ZYPREXA) disintegrating tablet 20 mg  20 mg Oral QHS Sharma Covert, MD   20 mg at 09/17/20 2056  . traZODone (DESYREL) tablet 50 mg  50 mg Oral QHS PRN Damita Dunnings B, MD   50 mg at 09/17/20 2056  . ziprasidone (GEODON) injection 20 mg  20 mg Intramuscular Daily PRN Connye Burkitt, NP   20 mg at 09/14/20 0219    Lab Results: No results found for this or any previous visit (from the past 66 hour(s)).  Blood Alcohol level:  Lab Results  Component Value Date   ETH <10 09/11/2020   ETH <10 79/89/2119    Metabolic Disorder Labs: Lab Results  Component Value Date   HGBA1C 5.1 09/15/2020   MPG 99.67 09/15/2020   No results found for: PROLACTIN Lab Results  Component Value Date   CHOL 167 09/15/2020   TRIG 48 09/15/2020   HDL 54 09/15/2020   CHOLHDL 3.1 09/15/2020   VLDL 10 09/15/2020   LDLCALC 103 (H) 09/15/2020    Physical Findings: AIMS: Facial and Oral Movements Muscles of Facial Expression: None, normal Lips and Perioral Area: None, normal Jaw: None, normal Tongue: None, normal,Extremity Movements Upper (arms, wrists, hands, fingers): None, normal Lower (legs, knees, ankles, toes): None, normal, Trunk Movements Neck, shoulders, hips: None, normal, Overall Severity Severity of abnormal movements (highest score from questions above): None, normal Incapacitation due to abnormal movements: None, normal Patient's awareness of abnormal movements (rate only patient's report): No Awareness, Dental Status Current problems with teeth and/or dentures?: No Does patient usually wear dentures?: No  CIWA:    COWS:     Musculoskeletal: Strength & Muscle Tone: within normal limits Gait & Station: normal Patient leans: N/A  Psychiatric Specialty Exam: Physical Exam HENT:     Head: Normocephalic and atraumatic.  Pulmonary:     Effort:  Pulmonary effort is normal.  Neurological:     Mental Status: He is alert.     Review of Systems  Cardiovascular: Negative for chest pain and palpitations.  Gastrointestinal: Negative for abdominal distention.  Neurological: Negative for headaches.    Blood pressure (!) 118/96, pulse (!) 123, temperature 97.7 F (36.5 C), resp. rate 18, height 5\' 9"  (1.753 m), weight 60.3 kg, SpO2 100 %.Body mass index is  19.64 kg/m.  General Appearance: Casual  Eye Contact:  None, patient looks at the floor but turns to say thank you when writer walks out the room  Speech:  Clear and Coherent  Volume:  Normal  Mood:  Euthymic  Affect:  Flat  Thought Process:  Linear  Orientation:  NA  Thought Content:  Logical  Suicidal Thoughts:  No  Homicidal Thoughts:  No  Memory:  Recent;   Fair  Judgement:  Other:  Improving  Insight:  Shallow  Psychomotor Activity:  Normal no strange fluttering of eyelids today  Concentration:  Concentration: Fair  Recall:  NA  Fund of Knowledge:  Fair  Language:  Fair  Akathisia:  No  Handed:  Right  AIMS (if indicated):     Assets:  Housing Leisure Time  ADL's:  Intact  Cognition:  WNL  Sleep:  Number of Hours: 8.25     Treatment Plan Summary: Daily contact with patient to assess and evaluate symptoms and progress in treatment  Edward [name] is a 19yo patient who continues to endorse bizarre behavior and is being treated for Acute Psychosis. Patient appears improved after sleeping well last night. Patient reports feeling better and appears to be more coherent than over the past few days and is able to voice better understanding of what is going on. Patient does have poor memory of some of his actions over the past few days, but this is to be expected as patient was very psychotic with an emphasis on paranoid thoughts. Patient did not endorse any paranoid thoughts today and seemed to be future oriented for the first time and had taken the initiative to clean his room  and thought about staying up and out of his room today. Will continue patient's current regimen and continue to monitor for behavior and thought process changes. Also notified patient again that he is being treated for chlamydia.  Schizophrenia Spectrum disorder unspecified type, likely paranoia - Zyprexa 20mg  QHS -Depakote 250mg  QHS - CT Head when patient no longer a flight risk  Chlamydia - Doxy 100mg  BID  PRN -Tylenol 650mg  q6h, pain -Maalox 65ml q4h, constipation -Atarax 25mg  TID, anxiety - Zyprexa 10mg  PRN, agitation - Ativan 1mg  PRN -Geodon 20mg , agiation -Milk of Mag 66mL, constipation -Trazodone 50mg  QHS, insomnia  PGY-1 Freida Busman, MD 09/18/2020, 1:45 PM

## 2020-09-18 NOTE — Progress Notes (Signed)
Recreation Therapy Notes  Date: 12.16.21 Time: 5825-1898 Location: [address] Dayroom  Group Topic: Self-Esteem  Goal Area(s) Addresses:  Patient will identify positive character traits about themselves. Patient will reflect on positive ways to increase self-esteem. Patient will verbalize benefit of increased self-esteem.  Behavioral Response: Minimal  Intervention: Blank license plates, Markers, Colored pencils  Activity: Personalized License Plates.  Patients were to create a personalized license plate to highlight some of the positive things about themselves.  Patients could highlights things such as were they are from, sports teams they like, personality traits, important people to them, etc.  Education:  Self-esteem, Discharge planning  Education Outcome: Acknowledges understanding/In group clarification offered/Needs additional education  Clinical Observations/Feedback: Pt came in for the last 10-15 minutes of group.  Pt was quiet and reserved.  Pt wrote his favorite team [facility]) on his plate.  Pt also stated Tobi Bastos and Belva Chimes are his favorite players on the team and that he got to see them play live in [place].      Victorino Sparrow, LRT/CTRS     Ria Comment, Kiarra Kidd A 09/18/2020 11:13 AM

## 2020-09-18 NOTE — BHH Group Notes (Signed)
The focus of this group is to help patients establish daily goals to achieve during treatment and discuss how the patient can incorporate goal setting into their daily lives to aide in recovery.  Pt did not attend group 

## 2020-09-18 NOTE — [facility] Group Notes (Signed)
Occupational Therapy Group Note Date: 09/18/2020 Group Topic/Focus: Socialization/Social Skills  Group Description: Group encouraged increased social engagement and participation through discussion/activity focused on improving socialization and age appropriate social skills. Patients were encouraged to fill out a structured worksheet with the following three prompts: one thing I want you to know about me, the hardest thing that I am dealing with today is, and my goal for today is... Discussion followed with patients sharing their responses and then transitioned into an interactive "Name 5" activity to promote socialization and orientation.   Therapeutic Goal(s): Demonstrate age-appropriate social skills and socialization within a structured group setting Demonstrate orientation to topic and identify relevant responses to group discussion.  Participation Level: Patient did not attend OT group session despite personal invitation.    Plan: Continue to engage patient in OT groups 2 - 3x/week.  09/18/2020  Ponciano Ort, MOT, OTR/L

## 2020-09-18 NOTE — Progress Notes (Signed)
   09/18/20 2332  Psych Admission Type (Psych Patients Only)  Admission Status Involuntary  Psychosocial Assessment  Patient Complaints None  Eye Contact Brief  Facial Expression Animated;Anxious  Affect Anxious  Speech International aid/development worker;Restless  Appearance/Hygiene Improved  Behavior Characteristics Cooperative  Mood Pleasant  Thought Process  Coherency Circumstantial  Content WDL  Delusions None reported or observed  Perception WDL  Hallucination None reported or observed  Judgment WDL  Confusion None  Danger to Self  Current suicidal ideation? Denies  Danger to Others  Danger to Others None reported or observed

## 2020-09-19 DIAGNOSIS — F23 Brief psychotic disorder: Secondary | ICD-10-CM | POA: Diagnosis not present

## 2020-09-19 DIAGNOSIS — F29 Unspecified psychosis not due to a substance or known physiological condition: Secondary | ICD-10-CM | POA: Diagnosis not present

## 2020-09-19 NOTE — Progress Notes (Signed)
Progress note    09/19/20 1015  Psych Admission Type (Psych Patients Only)  Admission Status Involuntary  Psychosocial Assessment  Patient Complaints Anxiety  Eye Contact Fair  Facial Expression Anxious;Pensive  Affect Anxious  Speech Logical/coherent  Interaction Isolative  Motor Activity Fidgety  Appearance/Hygiene Unremarkable  Behavior Characteristics Cooperative;Appropriate to situation;Anxious  Mood Anxious;Pleasant  Thought Process  Coherency WDL  Content WDL  Delusions None reported or observed  Perception WDL  Hallucination None reported or observed  Judgment Poor  Confusion None  Danger to Self  Current suicidal ideation? Denies  Danger to Others  Danger to Others None reported or observed

## 2020-09-19 NOTE — Progress Notes (Signed)
Pt stated he was feeling good, pt said he was ready to D/C tomorrow    09/19/20 2100  Psych Admission Type (Psych Patients Only)  Admission Status Involuntary  Psychosocial Assessment  Patient Complaints Anxiety  Eye Contact Fair  Facial Expression Anxious;Pensive  Affect Anxious  Speech Logical/coherent  Interaction Isolative  Motor Activity Fidgety  Appearance/Hygiene Unremarkable  Behavior Characteristics Cooperative  Mood Anxious  Thought Process  Coherency WDL  Content WDL  Delusions None reported or observed  Perception WDL  Hallucination None reported or observed  Judgment Poor  Confusion None  Danger to Self  Current suicidal ideation? Denies  Danger to Others  Danger to Others None reported or observed

## 2020-09-19 NOTE — BHH Group Notes (Signed)
The focus of this group is to help patients establish daily goals to achieve during treatment and discuss how the patient can incorporate goal setting into their daily lives to aide in recovery.   Patient did not attend group.

## 2020-09-19 NOTE — B[facility] Group Notes (Signed)
[facility] LCSW Group Therapy  09/19/2020 11:50 AM   Type of Therapy and Topic: Group Therapy: Anger Management   Description of Group: In this group, patients will learn helpful strategies and techniques to manage anger, express anger in alternative ways, change hostile attitudes, and prevent aggressive acts, such as verbal abuse and violence.This group will be process-oriented and eductional, with patients participating in exploration of their own experiences as well as giving and receiving support and challenge from other group members.  Therapeutic Goals: 1. Patient will learn to manage anger. 2. Patient will learn to stop violence or the threat of violence. 3. Patient will learn to develop self control over thoughts and actions. 4. Patient will receive support and feedback from others   Participation Level:  Did Not Attend  Therapeutic Modalities: Cognitive Behavioral Therapy Solution Focused Therapy Motivational Interviewing  Summary of Progress/Problems: Pt did not attend.   Marvelous Bouwens A Ireland Virrueta 09/19/2020, 11:50 AM

## 2020-09-19 NOTE — Progress Notes (Signed)
[facility] MD Progress Note  09/19/2020 12:38 PM KAHLEB MCCLANE  MRN:  [id number] Subjective:  This AM patient reports that he slept well again last night and did not have nightmares. Patient reports that he felt yesterday went really well and he did attend some groups and tried to stay in the dayroom. Patient reports that he has been having conversations with hid dad and mom and they have been going well. His appetite continues to be good and his mood is good today. Patient contracts for safety today and does not endorse HI nor AVH. Patient was able to talk about the importance of not ingesting THC in the future  And how this effects his mental health. Patient was able to briefly talk about his cardiac diagnosis as well. Principal Problem: Schizophrenia spectrum disorder with psychotic disorder type not yet determined ([place]) Diagnosis: Principal Problem:   Schizophrenia spectrum disorder with psychotic disorder type not yet determined ([place]) Active Problems:   Marijuana abuse   Benzodiazepine abuse ([place])  Total Time spent with patient: 15 minutes  Past Psychiatric History: See H&P  Past Medical History:  Past Medical History:  Diagnosis Date  . ADHD (attention deficit hyperactivity disorder)     Past Surgical History:  Procedure Laterality Date  . tubes in ears     Family History: History reviewed. No pertinent family history. Family Psychiatric  History: H&P Social History:  Social History   Substance and Sexual Activity  Alcohol Use Not Currently   Comment: occ     Social History   Substance and Sexual Activity  Drug Use Yes  . Frequency: 2.0 times per week  . Types: Marijuana    Social History   Socioeconomic History  . Marital status: Single    Spouse name: Not on file  . Number of children: Not on file  . Years of education: Not on file  . Highest education level: Not on file  Occupational History  . Not on file  Tobacco Use  . Smoking status: Current Every Day Smoker     Packs/day: 0.50    Types: Cigarettes  . Smokeless tobacco: Never Used  Vaping Use  . Vaping Use: Some days  Substance and Sexual Activity  . Alcohol use: Not Currently    Comment: occ  . Drug use: Yes    Frequency: 2.0 times per week    Types: Marijuana  . Sexual activity: Not on file  Other Topics Concern  . Not on file  Social History Narrative  . Not on file   Social Determinants of Health   Financial Resource Strain: Not on file  Food Insecurity: Not on file  Transportation Needs: Not on file  Physical Activity: Not on file  Stress: Not on file  Social Connections: Not on file   Additional Social History:                         Sleep: Good  Appetite:  Good  Current Medications: Current Facility-Administered Medications  Medication Dose Route Frequency Provider Last Rate Last Admin  . acetaminophen (TYLENOL) tablet 650 mg  650 mg Oral Q6H PRN Connye Burkitt, NP   650 mg at 09/17/20 1822  . alum & mag hydroxide-simeth (MAALOX/MYLANTA) 200-200-20 MG/5ML suspension 30 mL  30 mL Oral Q4H PRN Connye Burkitt, NP      . benzonatate (TESSALON) capsule 100 mg  100 mg Oral TID PRN Sharma Covert, MD      .  divalproex (DEPAKOTE ER) 24 hr tablet 250 mg  250 mg Oral QHS Sharma Covert, MD   250 mg at 09/18/20 2108  . doxycycline (VIBRA-TABS) tablet 100 mg  100 mg Oral Q12H Sharma Covert, MD   100 mg at 09/19/20 1008  . hydrOXYzine (ATARAX/VISTARIL) tablet 25 mg  25 mg Oral TID PRN Connye Burkitt, NP   25 mg at 09/18/20 2108  . LORazepam (ATIVAN) tablet 1 mg  1 mg Oral Daily PRN Connye Burkitt, NP   1 mg at 09/14/20 0220   Or  . LORazepam (ATIVAN) injection 1 mg  1 mg Intramuscular Daily PRN Connye Burkitt, NP      . magnesium hydroxide (MILK OF MAGNESIA) suspension 30 mL  30 mL Oral Daily PRN Connye Burkitt, NP      . nicotine polacrilex (NICORETTE) gum 2 mg  2 mg Oral PRN Lindon Romp A, NP   2 mg at 09/19/20 1023  . OLANZapine zydis (ZYPREXA)  disintegrating tablet 10 mg  10 mg Oral BID PRN Connye Burkitt, NP   10 mg at 09/13/20 2156  . OLANZapine zydis (ZYPREXA) disintegrating tablet 20 mg  20 mg Oral QHS Sharma Covert, MD   20 mg at 09/18/20 2108  . traZODone (DESYREL) tablet 50 mg  50 mg Oral QHS PRN Freida Busman, MD   50 mg at 09/18/20 2108  . ziprasidone (GEODON) injection 20 mg  20 mg Intramuscular Daily PRN Connye Burkitt, NP   20 mg at 09/14/20 8416    Lab Results: No results found for this or any previous visit (from the past 39 hour(s)).  Blood Alcohol level:  Lab Results  Component Value Date   ETH <10 09/11/2020   ETH <10 60/63/0160    Metabolic Disorder Labs: Lab Results  Component Value Date   HGBA1C 5.1 09/15/2020   MPG 99.67 09/15/2020   No results found for: PROLACTIN Lab Results  Component Value Date   CHOL 167 09/15/2020   TRIG 48 09/15/2020   HDL 54 09/15/2020   CHOLHDL 3.1 09/15/2020   VLDL 10 09/15/2020   LDLCALC 103 (H) 09/15/2020    Physical Findings: AIMS: Facial and Oral Movements Muscles of Facial Expression: None, normal Lips and Perioral Area: None, normal Jaw: None, normal Tongue: None, normal,Extremity Movements Upper (arms, wrists, hands, fingers): None, normal Lower (legs, knees, ankles, toes): None, normal, Trunk Movements Neck, shoulders, hips: None, normal, Overall Severity Severity of abnormal movements (highest score from questions above): None, normal Incapacitation due to abnormal movements: None, normal Patient's awareness of abnormal movements (rate only patient's report): No Awareness, Dental Status Current problems with teeth and/or dentures?: No Does patient usually wear dentures?: No  CIWA:    COWS:     Musculoskeletal: Strength & Muscle Tone: within normal limits Gait & Station: normal Patient leans: N/A  Psychiatric Specialty Exam: Physical Exam HENT:     Head: Normocephalic and atraumatic.  Pulmonary:     Effort: Pulmonary effort is normal.   Neurological:     Mental Status: He is alert.     Review of Systems  Cardiovascular: Negative for chest pain and palpitations.  Gastrointestinal: Negative for abdominal pain.  Neurological: Negative for headaches.    Blood pressure (!) 118/96, pulse (!) 123, temperature 97.7 F (36.5 C), resp. rate 18, height 5\' 9"  (1.753 m), weight 60.3 kg, SpO2 100 %.Body mass index is 19.64 kg/m.  General Appearance: Casual  Eye Contact:  Fair  Speech:  Clear and Coherent  Volume:  Normal  Mood:  Euthymic  Affect:  Appropriate  Thought Process:  Coherent  Orientation:  NA  Thought Content:  Logical  Suicidal Thoughts:  No  Homicidal Thoughts:  No  Memory:  Recent;   Fair  Judgement:  Fair  Insight:  Improving  Psychomotor Activity:  Normal  Concentration:  Concentration: Fair  Recall:  NA  Fund of Knowledge:  Fair  Language:  Good  Akathisia:  No  Handed:  Right  AIMS (if indicated):     Assets:  Desire for Improvement Housing Social Support  ADL's:  Intact  Cognition:  WNL  Sleep:  Number of Hours: 6     Treatment Plan Summary: Daily contact with patient to assess and evaluate symptoms and progress in treatment  Mr. Clausing is a 19yo patient who continues to endorse bizarre behavior and is being treated for Acute Psychosis. Patient reports that he is sleeping well and is no longer reporting nightmares nor is exhibiting strange and paranoid behavior. Patient appears to be stabilizing on his current medication regimen. Patient has been on UR and these will be removed to see how the patient does being able to walk off the unit for small tasks. Spoke with patient's father and father is trying to get son back into [employer]. At this point patient needs a discharge plan. Father reports that [facility] will let him know today whether or not they will take him. Patient will need to continue to exhibit behavior that shows that he is not paranoid and that he is regulating his mood  well and is not endorsing AVH.  Schizophrenia Spectrum disorder unspecified type, likely paranoia - Zyprexa 20mg  QHS -Depakote 250mg  QHS - CT Head when patient no longer a flight risk  Chlamydia - Doxy 100mg  BID  PRN -Tylenol 650mg  q6h, pain -Maalox 49ml q4h, constipation -Atarax 25mg  TID, anxiety - Zyprexa 10mg  PRN, agitation - Ativan 1mg  PRN -Geodon 20mg , agiation -Milk of Mag 65mL, constipation -Trazodone 50mg  QHS, insomnia  PGY-1 Freida Busman, MD 09/19/2020, 12:38 PM

## 2020-09-19 NOTE — Discharge Summary (Signed)
Physician Discharge Summary Note  Patient:  Edward [name] is an 19 y.o., male MRN:  [id number] DOB:  07/14/2001 Patient phone:  [phone] (home)  Patient address:   [place] [address],  Total Time spent with patient: 30 minutes  Date of Admission:  09/11/2020 Date of Discharge: 09/20/2020  Reason for Admission:  Edward [name] is a 19 yo patient w/ hx of substance induced mood disorder and ADHD who presented to [facility] w/ IVC for psychotic behavior. Patient reports that he was at [place] to "detox." Patient reports that he was detoxing from [place] and his mother's drinking. Per patient he normally lives with his parents. Patient reported to this writer that he has not smoked MJ in 1-2 months; however in the ED he reported that he had not smoked in a week. Patient UDS was positive for MJ. Patient reports that he is very concerned that "y'all keep poking me!" Patient reports that he has already "gotten stuck 3 times" and that the blood in his arm has been drained. Patient reports that he cannot lift his L arm because it is dead from the blood being drained. Patient also reports that his L leg is now feeling weird because his L arm is "dead." At time of admission patient reports that he does not have AVH nor SI or HI; however he does not feel safe on the unit as he concerned that "y'all might stick me." Per EMR review patient began claiming that her stuck multiple times at [facility] and that the organization also gave him Tramadol. However, a representative was able to confirm that they could not have provided him Tramadol. [facility] also reported that the patient reported that he was talking to dead people. Patient was also noted to verbally aggressive and agitated in the ED and had increased delusions at night.   Patient dropped out of school in the 10th grade. Patient reports that he just "didn't care" he reports being in mainstream classes. Patient reports that he  started smoking MJ at 19yo the year before he dropped out of school. Patient's last job was in [place] however he was fired about 1.5 months ago because he kept sleeping in.   Principal Problem: Schizophrenia spectrum disorder with psychotic disorder type not yet determined [facility]) Discharge Diagnoses: Principal Problem:   Schizophrenia spectrum disorder with psychotic disorder type not yet determined [facility]) Active Problems:   Marijuana abuse   Benzodiazepine abuse ([place])   Past Psychiatric History:  Patient has never been hospitalized in the psychiatric hospital or ward. Patient has been to the ED at least 3 times prior to this admission and was diagnosed with substance induced psychosis. His UDS was positive for cocaine, THC, amphetamines, and benzos at the last visit. No amphetamines were detected in the visit prior to that and no cocaine was detected in the first appearance. It is possible that the amphetamines were 2/2 his prescribed Vyvanse and the Benzos were 2/2 benzos provided in the ED. Patient is no longer on Vyvanse at this admission.  Past Medical History:  Past Medical History:  Diagnosis Date  . ADHD (attention deficit hyperactivity disorder)     Past Surgical History:  Procedure Laterality Date  . tubes in ears     Family History: History reviewed. No pertinent family history. Family Psychiatric  History: None reported by patient other substance use disorder EtoH in mother. Social History:  Social History   Substance and Sexual Activity  Alcohol Use  Not Currently   Comment: occ     Social History   Substance and Sexual Activity  Drug Use Yes  . Frequency: 2.0 times per week  . Types: Marijuana    Social History   Socioeconomic History  . Marital status: Single    Spouse name: Not on file  . Number of children: Not on file  . Years of education: Not on file  . Highest education level: Not on file  Occupational History  . Not on file  Tobacco Use  . Smoking  status: Current Every Day Smoker    Packs/day: 0.50    Types: Cigarettes  . Smokeless tobacco: Never Used  Vaping Use  . Vaping Use: Some days  Substance and Sexual Activity  . Alcohol use: Not Currently    Comment: occ  . Drug use: Yes    Frequency: 2.0 times per week    Types: Marijuana  . Sexual activity: Not on file  Other Topics Concern  . Not on file  Social History Narrative  . Not on file   Social Determinants of Health   Financial Resource Strain: Not on file  Food Insecurity: Not on file  Transportation Needs: Not on file  Physical Activity: Not on file  Stress: Not on file  Social Connections: Not on file    Hospital Course:  Mr. Edward [name] was admitted and initially there was concern for substance induced psychosis as patient had prior ED visits with this diagnosis and similar behavior. However, this presentation patient was also hallucinating and very paranoid. Patient was started on Zyprexa 10mg  and 1mg  Risperdal BID. Patient continued to exhibit paranoia about getting blood work and receiving injections. Patient Zyprexa was increased to 20mg  and patient continued to be paranoid with irritable mood. On patient's second night at [facility] he had an episode of increased agitation getting up from his bed and had to receive Geodon. The next morning patient reported be able to remember some moments from that night and that he had been very scared as he was not sure what was reality and what was not. A similar episode was recorded when the patient was in the ED. Patient continued to have poor sleep with night mares but did not have another further night time agitation episode. At this point patient had not had THC in at least 5 days and reported that he had also stopped using benzos recently as well. Patient was continuing to exhibit bizarre behavior and somewhat pressured speech with irritable mood. Patient's paranoia swtiched to being worried that staff had stolen money from him. Patient  hygiene was also very poor. Patient's diagnosis changed from substance induced to  Schizophrenia Spectrum Disorder, Unspecified. Patient received an EKG do as a routine check of his QTc while on antipsychotics. 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 Cardiology reported that since patient was asymptomatic nothing needed to be done urgently but patient will have to follow-up outpatient. Depakote was added to patient regimen to address some of his mood and impulsivity. Patient also began to sleep more during the day and reported having a hard time stay awake. Patient Zyprexa was switched to one time at night and patient began to sleep well at night with no nightmares. Patient was no longer endorsing paranoia and was able to clean and organize things in his room as well as hold decent conversations with his parents on the phone. [name] reported a letter he received stating patient had been diagnosed with  chlamydia and treatment was started with doxy. Patient's regimen at discharge was Depakote ER 250 QHS, Doxy 100mg  BID, Zyprexa 20mg  QHS w/ Trazodone 50mg  QHS PRN for insomnia and Atarax 25mg  TID PRN for anxiety. Patient was also provided nicorette gum.  Follow-up:  - Please reevaluate WPW syndrome - Please reassess patient compliance and any side effects with his new medication regimen - Please reassess patient substance use   Physical Findings: AIMS: Facial and Oral Movements Muscles of Facial Expression: None, normal Lips and Perioral Area: None, normal Jaw: None, normal Tongue: None, normal,Extremity Movements Upper (arms, wrists, hands, fingers): None, normal Lower (legs, knees, ankles, toes): None, normal, Trunk Movements Neck, shoulders, hips: None, normal, Overall Severity Severity of abnormal movements (highest score from questions above): None, normal Incapacitation due to abnormal movements: None, normal Patient's awareness of abnormal movements (rate only  patient's report): No Awareness, Dental Status Current problems with teeth and/or dentures?: No Does patient usually wear dentures?: No  CIWA:    COWS:     Musculoskeletal: Strength & Muscle Tone: within normal limits Gait & Station: normal Patient leans: N/A  Psychiatric Specialty Exam: Physical Exam HENT:     Head: Normocephalic.  Pulmonary:     Effort: Pulmonary effort is normal.  Neurological:     Mental Status: He is alert.     Review of Systems  Blood pressure (!) 118/96, pulse (!) 123, temperature 97.7 F (36.5 C), resp. rate 18, height 5\' 9"  (1.753 m), weight 60.3 kg, SpO2 100 %.Body mass index is 19.64 kg/m.  General Appearance: Casual  Eye Contact:  Fair  Speech:  Normal Rate  Volume:  Normal  Mood:  Euthymic  Affect:  Flat  Thought Process:  Coherent and Descriptions of Associations: Circumstantial  Orientation:  Full (Time, Place, and Person)  Thought Content:  Logical  Suicidal Thoughts:  No  Homicidal Thoughts:  No  Memory:  Immediate;   Fair Recent;   Fair Remote;   Fair  Judgement:  Intact  Insight:  Fair  Psychomotor Activity:  Normal  Concentration:  Concentration: Fair and Attention Span: Fair  Recall:  [place] of Knowledge:  Good  Language:  Good  Akathisia:  Negative  Handed:  Right  AIMS (if indicated):     Assets:  Desire for Improvement Housing Resilience Social Support  ADL's:  Intact  Cognition:  WNL  Sleep:  Number of Hours: 7.75     Have you used any form of tobacco in the last 30 days? (Cigarettes, Smokeless Tobacco, Cigars, and/or Pipes): Yes  Has this patient used any form of tobacco in the last 30 days? (Cigarettes, Smokeless Tobacco, Cigars, and/or Pipes) Yes, No  Blood Alcohol level:  Lab Results  Component Value Date   ETH <10 09/11/2020   ETH <10 93/79/0240    Metabolic Disorder Labs:  Lab Results  Component Value Date   HGBA1C 5.1 09/15/2020   MPG 99.67 09/15/2020   No results found for: PROLACTIN Lab  Results  Component Value Date   CHOL 167 09/15/2020   TRIG 48 09/15/2020   HDL 54 09/15/2020   CHOLHDL 3.1 09/15/2020   VLDL 10 09/15/2020   LDLCALC 103 (H) 09/15/2020    See Psychiatric Specialty Exam and Suicide Risk Assessment completed by Attending Physician prior to discharge.  Discharge destination:  Home  Is patient on multiple antipsychotic therapies at discharge:  No   Has Patient had three or more failed trials of antipsychotic monotherapy by history:  No  Recommended Plan for Multiple Antipsychotic Therapies: NA  Discharge Instructions    Diet - low sodium heart healthy   Complete by: As directed    Increase activity slowly   Complete by: As directed      Allergies as of 09/20/2020   No Known Allergies     Medication List    TAKE these medications     Indication  divalproex 250 MG 24 hr tablet Commonly known as: DEPAKOTE ER Take 1 tablet (250 mg total) by mouth at bedtime.  Indication: mood stability   doxycycline 100 MG tablet Commonly known as: VIBRA-TABS Take 1 tablet (100 mg total) by mouth every 12 (twelve) hours.  Indication: Infection due to Chlamydiae Species Bacteria, Sexually Transmitted Disease   hydrOXYzine 25 MG tablet Commonly known as: ATARAX/VISTARIL Take 1 tablet (25 mg total) by mouth 3 (three) times daily as needed for anxiety.  Indication: Feeling Anxious   OLANZapine zydis 20 MG disintegrating tablet Commonly known as: ZYPREXA Take 1 tablet (20 mg total) by mouth at bedtime.  Indication: Schizophrenia   traZODone 50 MG tablet Commonly known as: DESYREL Take 1 tablet (50 mg total) by mouth at bedtime as needed for sleep.  Indication: Trouble Sleeping       Follow-up Information    Dixie Dials, MD. Schedule an appointment as soon as possible for a visit in 2 week(s).   Specialty: Cardiology Why: New Address is [address] [place], [place] [address] Contact information: [address]  [address] [phone]        Services, [facility]. Go on 09/22/2020.   Why: You have a hospital follow up appointment on 09/22/20 at 10:00 am  for therapy and medication management services.  This appointment will be held in person.  Please bring your photo ID, insurance card and proof of any income with you. Contact information: [place] [address] [phone]               Follow-up recommendations:  Follow up recommendations: - Activity as tolerated. - Diet as recommended by PCP. - Keep all scheduled follow-up appointments as recommended.   Comments:  Patient is instructed to take all prescribed medications as recommended. Report any side effects or adverse reactions to your outpatient psychiatrist. Patient is instructed to abstain from alcohol and illegal drugs while on prescription medications. In the event of worsening symptoms, patient is instructed to call the crisis hotline, 911, or go to the nearest emergency department for evaluation and treatment.    Signed: Sharma Covert, MD 09/20/2020, 8:21 AM

## 2020-09-19 NOTE — Plan of Care (Signed)
  Problem: Education: Goal: Knowledge of [facility] Education information/materials will improve Outcome: Progressing Goal: Emotional status will improve Outcome: Progressing Goal: Mental status will improve Outcome: Progressing Goal: Verbalization of understanding the information provided will improve Outcome: Progressing   

## 2020-09-20 MED ORDER — DOXYCYCLINE HYCLATE 100 MG PO TABS: 100.0000 mg | ORAL_TABLET | Freq: Two times a day (BID) | ORAL | 0 refills | Status: DC

## 2020-09-20 MED ORDER — DIVALPROEX SODIUM ER 250 MG PO TB24: 250.0000 mg | ORAL_TABLET | Freq: Every day | ORAL | 0 refills | Status: DC

## 2020-09-20 MED ORDER — HYDROXYZINE HCL 25 MG PO TABS: 25.0000 mg | ORAL_TABLET | Freq: Three times a day (TID) | ORAL | 0 refills | Status: DC | PRN

## 2020-09-20 MED ORDER — TRAZODONE HCL 50 MG PO TABS: 50.0000 mg | ORAL_TABLET | Freq: Every evening | ORAL | 0 refills | Status: DC | PRN

## 2020-09-20 MED ORDER — OLANZAPINE 20 MG PO TBDP: 20.0000 mg | ORAL_TABLET | Freq: Every day | ORAL | 0 refills | Status: DC

## 2020-09-20 NOTE — [facility] Group Notes (Signed)
 .  Psychoeducational Group Note  Date: 09-20-20 Time: 0900-1000    Goal Setting   Purpose of Group: This group helps to provide patients with the steps of setting a goal that is specific, measurable, attainable, realistic and time specific. A discussion on how we keep ourselves stuck with negative self talk.    Participation Level:  Did not attend  Paulino Rily

## 2020-09-20 NOTE — BHH Counselor (Signed)
Clinical Social Work Note  CSW made additional attempts to Patent attorney without success.Fax with information was sent from two different machines without going through. CSW called father back, who stated that he had been told phone system was down at [employer].  He provided cell phone number of Josh from intake department, and message was left.  CSW eventually spoke with the representative from [facility] who stated that program focuses solely on substance abuse and they feel the patient needs a dual diagnosis program.  He listed at least 3 different programs whose names he provided to father.  CSW talked with father once again, who was once again irate.  He was on his way to pick up patient however.    Selmer Dominion, LCSW 09/20/2020, 1:13 PM

## 2020-09-20 NOTE — Progress Notes (Signed)
[place] NOVEL CORONAVIRUS (COVID-19) DAILY CHECK-OFF SYMPTOMS - answer yes or no to each - every day NO YES  Have you had a fever in the past 24 hours?  . Fever (Temp > 37.80C / 100F) X   Have you had any of these symptoms in the past 24 hours? . New Cough .  Sore Throat  .  Shortness of Breath .  Difficulty Breathing .  Unexplained Body Aches   X   Have you had any one of these symptoms in the past 24 hours not related to allergies?   . Runny Nose .  Nasal Congestion .  Sneezing   X   If you have had runny nose, nasal congestion, sneezing in the past 24 hours, has it worsened?  X   EXPOSURES - check yes or no X   Have you traveled outside the state in the past 14 days?  X   Have you been in contact with someone with a confirmed diagnosis of COVID-19 or PUI in the past 14 days without wearing appropriate PPE?  X   Have you been living in the same home as a person with confirmed diagnosis of COVID-19 or a PUI (household contact)?    X   Have you been diagnosed with COVID-19?    X              What to do next: Answered NO to all: Answered YES to anything:   Proceed with unit schedule Follow the [facility] Inpatient Flowsheet.   

## 2020-09-20 NOTE — B[facility] Counselor (Addendum)
Clinical Social Work Note  CSW received phone call from patient's father Edward [name] [phone], who was very upset that patient is being discharged "without a plan in place."  CSW explained the plan that is in the chart for patient to go for an assessment at [facility] on Monday morning, then to receive medication management and therapy services there.  He stated that he has been told all along that [facility] would be the place patient would go at discharge, but staff here has never been in touch with that program.  He received a text from them this morning that patient is "likely" not able to return there because he needs a higher level of care.  Father stated if he needs a "higher level of care," we should not be discharging him.  It was explained to father that this is an acute stabilization facility and patients typically do go home to start at the lowest possible level of care that has not previously been tried.  CSW also offered to fax information to [facility] and speak to them about the possibility of patient returning there, since this has not yet been done.  CSW to call him back when more is known.  Patient told father he would discharge at 10:30am, and CSW told him it would not happen before 1:00pm.  He asked that CSW inform the patient so that he "won't blow up my phone."  This was done.  CSW attempted 7 times to call [facility] at three different telephone numbers, with no answer despite letting it ring for a significant period of time.  CSW faxed patient's discharge summary and discharge suicide risk assessment to [facility].  CSW will continue attempts up until 12:00pm and call father back.  Edward Dominion, LCSW 09/20/2020, 10:02 AM

## 2020-09-20 NOTE — Progress Notes (Signed)
  [facility] Adult Case Management Discharge Plan :  Will you be returning to the same living situation after discharge:  No.  Will go stay with father, was not accepted to return to [facility] At discharge, do you have transportation home?: Yes,  father Do you have the ability to pay for your medications: Yes,  has insurance  Release of information consent forms completed and emailed to Medical Records, then turned in to Medical Records by CSW.   Patient to Follow up at:  Follow-up Information    Dixie Dials, MD. Schedule an appointment as soon as possible for a visit in 2 week(s).   Specialty: Cardiology Why: New Address is [address] [place], [place] [address] Contact information: [place] [address] [phone]        Services, [facility]. Go on 09/22/2020.   Why: You have a hospital follow up appointment on 09/22/20 at 10:00 am  for therapy and medication management services.  This appointment will be held in person.  Please bring your photo ID, insurance card and proof of any income with you. Contact information: [place] [address] [phone]               Next level of care provider has access to [place] and Suicide Prevention discussed: Yes,  with mother  Have you used any form of tobacco in the last 30 days? (Cigarettes, Smokeless Tobacco, Cigars, and/or Pipes): Yes  Has patient been referred to the Quitline?: Patient refused referral  Patient has been referred for addiction treatment: Pt. refused referral to residential, but agreed to outpatient  Maretta Los, LCSW 09/20/2020, 1:24 PM

## 2020-09-20 NOTE — [facility] Suicide Risk Assessment (Signed)
[facility] Discharge Suicide Risk Assessment   Principal Problem: Schizophrenia spectrum disorder with psychotic disorder type not yet determined [facility]) Discharge Diagnoses: Principal Problem:   Schizophrenia spectrum disorder with psychotic disorder type not yet determined ([place]) Active Problems:   Marijuana abuse   Benzodiazepine abuse ([place])   Total Time spent with patient: 20 minutes  Musculoskeletal: Strength & Muscle Tone: within normal limits Gait & Station: normal Patient leans: N/A  Psychiatric Specialty Exam: Review of Systems  All other systems reviewed and are negative.   Blood pressure (!) 118/96, pulse (!) 123, temperature 97.7 F (36.5 C), resp. rate 18, height 5\' 9"  (1.753 m), weight 60.3 kg, SpO2 100 %.Body mass index is 19.64 kg/m.  General Appearance: Casual  Eye Contact::  Fair  Speech:  Normal Rate409  Volume:  Normal  Mood:  Euthymic  Affect:  Congruent  Thought Process:  Coherent and Descriptions of Associations: Intact  Orientation:  Full (Time, Place, and Person)  Thought Content:  Logical  Suicidal Thoughts:  No  Homicidal Thoughts:  No  Memory:  Immediate;   Fair Recent;   Fair Remote;   Fair  Judgement:  Intact  Insight:  Fair  Psychomotor Activity:  Normal  Concentration:  Fair  Recall:  [employer] of Knowledge:Good  Language: Good  Akathisia:  Negative  Handed:  Right  AIMS (if indicated):     Assets:  Desire for Improvement Financial Resources/Insurance Housing Resilience Social Support  Sleep:  Number of Hours: 7.75  Cognition: WNL  ADL's:  Intact   Mental Status Per Nursing Assessment::   On Admission:  NA  Demographic Factors:  Male, Adolescent or young adult and Caucasian  Loss Factors: NA  Historical Factors: Impulsivity  Risk Reduction Factors:   Living with another person, especially a relative and Positive social support  Continued Clinical Symptoms:  Alcohol/Substance Abuse/Dependencies Schizophrenia:   Less than  22 years old Paranoid or undifferentiated type  Cognitive Features That Contribute To Risk:  Thought constriction (tunnel vision)    Suicide Risk:  Minimal: No identifiable suicidal ideation.  Patients presenting with no risk factors but with morbid ruminations; may be classified as minimal risk based on the severity of the depressive symptoms   Follow-up Information    Dixie Dials, MD. Schedule an appointment as soon as possible for a visit in 2 week(s).   Specialty: Cardiology Why: New Address is [address] [place], [place] [address] Contact information: [place] [address] [phone]        Services, [facility]. Go on 09/22/2020.   Why: You have a hospital follow up appointment on 09/22/20 at 10:00 am  for therapy and medication management services.  This appointment will be held in person.  Please bring your photo ID, insurance card and proof of any income with you. Contact information: [place] [address] [phone]               Plan Of Care/Follow-up recommendations:  Activity:  ad lib Tests:  You need to follow up with your PCP and Cardiology with abnormal EKG results. Also follow up with psychiatry and have blood tests for valproic acid levels, liver function enzymes and complete blood count. Other:  Follow up with appointments as per above and do not use any illicit substances in that it will worsen your condition. Complete the treatment of chlamydia treatment with doxycycline.  Sharma Covert, MD 09/20/2020, 7:48 AM

## 2020-09-20 NOTE — Progress Notes (Signed)
   09/20/20 1000  Psych Admission Type (Psych Patients Only)  Admission Status Involuntary  Psychosocial Assessment  Patient Complaints None  Eye Contact Fair  Affect Anxious  Speech Logical/coherent  Interaction Isolative  Motor Activity Fidgety  Appearance/Hygiene Unremarkable  Behavior Characteristics Cooperative;Anxious  Mood Anxious  Thought Process  Coherency WDL  Content WDL  Delusions None reported or observed  Perception WDL  Hallucination None reported or observed  Judgment Poor  Confusion None  Danger to Self  Current suicidal ideation? Denies  Danger to Others  Danger to Others None reported or observed

## 2020-09-20 NOTE — Progress Notes (Signed)
Pt discharged to lobby. Pt was stable and appreciative at that time. All papers  were given and valuables returned. Verbal understanding expressed. Denies SI/HI and A/VH. Pt given opportunity to express concerns and ask questions. °

## 2021-11-26 ENCOUNTER — Encounter (HOSPITAL_COMMUNITY): Payer: Self-pay

## 2021-11-26 ENCOUNTER — Emergency Department (HOSPITAL_COMMUNITY)
Admission: EM | Admit: 2021-11-26 | Discharge: 2021-11-26 | Disposition: A | Discharge status: Home / Self Care | Payer: Medicaid Other | Attending: Emergency Medicine | Admitting: Emergency Medicine

## 2021-11-26 DIAGNOSIS — Z20822 Contact with and (suspected) exposure to covid-19: Secondary | ICD-10-CM | POA: Insufficient documentation

## 2021-11-26 DIAGNOSIS — R000 Tachycardia, unspecified: Secondary | ICD-10-CM | POA: Insufficient documentation

## 2021-11-26 DIAGNOSIS — T40601A Poisoning by unspecified narcotics, accidental (unintentional), initial encounter: Secondary | ICD-10-CM | POA: Insufficient documentation

## 2021-11-26 DIAGNOSIS — T50901A Poisoning by unspecified drugs, medicaments and biological substances, accidental (unintentional), initial encounter: Secondary | ICD-10-CM

## 2021-11-26 LAB — RESP PANEL BY RT-PCR (FLU A&B, COVID) ARPGX2
Influenza A by PCR: NEGATIVE
Influenza B by PCR: NEGATIVE
SARS Coronavirus 2 by RT PCR: NEGATIVE

## 2021-11-26 LAB — CBC
HCT: 40.6 % (ref 39.0–52.0)
Hemoglobin: 14.2 g/dL (ref 13.0–17.0)
MCH: 33.5 pg (ref 26.0–34.0)
MCHC: 35 g/dL (ref 30.0–36.0)
MCV: 95.8 fL (ref 80.0–100.0)
Platelets: 175 10*3/uL (ref 150–400)
RBC: 4.24 MIL/uL (ref 4.22–5.81)
RDW: 12.4 % (ref 11.5–15.5)
WBC: 21 10*3/uL — ABNORMAL HIGH (ref 4.0–10.5)
nRBC: 0 % (ref 0.0–0.2)

## 2021-11-26 LAB — COMPREHENSIVE METABOLIC PANEL
ALT: 29 U/L (ref 0–44)
AST: 34 U/L (ref 15–41)
Albumin: 4.5 g/dL (ref 3.5–5.0)
Alkaline Phosphatase: 61 U/L (ref 38–126)
Anion gap: 10 (ref 5–15)
BUN: 9 mg/dL (ref 6–20)
CO2: 24 mmol/L (ref 22–32)
Calcium: 8.7 mg/dL — ABNORMAL LOW (ref 8.9–10.3)
Chloride: 106 mmol/L (ref 98–111)
Creatinine, Ser: 0.75 mg/dL (ref 0.61–1.24)
GFR, Estimated: >60 mL/min (ref >60)
Glucose, Bld: 90 mg/dL (ref 70–99)
Potassium: 5.7 mmol/L — ABNORMAL HIGH (ref 3.5–5.1)
Sodium: 140 mmol/L (ref 135–145)
Total Bilirubin: 0.4 mg/dL (ref 0.3–1.2)
Total Protein: 7.2 g/dL (ref 6.5–8.1)

## 2021-11-26 LAB — RAPID URINE DRUG SCREEN, HOSP PERFORMED
Amphetamines: NOT DETECTED
Barbiturates: NOT DETECTED
Benzodiazepines: NOT DETECTED
Cocaine: NOT DETECTED
Opiates: NOT DETECTED
Tetrahydrocannabinol: POSITIVE — AB

## 2021-11-26 LAB — ETHANOL: Alcohol, Ethyl (B): 143 mg/dL — ABNORMAL HIGH (ref <10)

## 2021-11-26 LAB — SALICYLATE LEVEL: Salicylate Lvl: <7 mg/dL — ABNORMAL LOW (ref 7.0–30.0)

## 2021-11-26 LAB — CBG MONITORING, ED: Glucose-Capillary: 95 mg/dL (ref 70–99)

## 2021-11-26 LAB — ACETAMINOPHEN LEVEL: Acetaminophen (Tylenol), Serum: <10 ug/mL — ABNORMAL LOW (ref 10–30)

## 2021-11-26 MED ORDER — ONDANSETRON HCL 4 MG/2ML IJ SOLN
4.0000 mg | Freq: Once | INTRAMUSCULAR | Status: AC
  Administered 2021-11-26: 4 mg via INTRAVENOUS
  Filled 2021-11-26: qty 2

## 2021-11-26 NOTE — ED Triage Notes (Signed)
Pt arrives via [facility] for Overdose. Per friend on scene pt usually takes Percocet, and he was drinking alcohol last night. Friend reports that pt was not breathing and CPR was initiated. When fire dept arrived pt had pulses, 4 mg narcan given. On EMS arrival pt was confused, cyanotic, combative. Pt was restrained by EMS. Enroute pt's HR elevated to 180s, but came down to   Pt had emesis enroute and was given LR BOLUS, no antiemetic.   EMS last VS 140/100, HR 110, RR 12, 100% on 2 lpm via [place], EtCO2 32.

## 2021-11-26 NOTE — ED Notes (Signed)
Checked patient blood sugar it was 95 notified RN of blood sugar patient is resting with call bell in reach

## 2021-11-26 NOTE — ED Provider Notes (Signed)
[facility] [place] EMERGENCY DEPARTMENT Provider Note   CSN: [id number] Arrival date & time: 11/26/21  0846     History  Chief Complaint  Patient presents with   Drug Overdose    Edward [name] is a 21 y.o. male.  HPI Patient arrives via EMS after possible overdose.  The patient is awake and alert, offers that he was using Percocet, drinking alcohol yesterday.  He is unaware of events that transpired prior to ED arrival.  Per EMS the patient was found to be apneic, receiving CPR by bystanders on their arrival.  Patient received 4 mg of Narcan.  He was initially noted to be cyanotic.  He was subsequently combative, confused, requiring physical restraints in route.  Patient also noted to have substantial tachycardia, though this improved while in transport.  Patient currently denies pain, states that he feels somewhat nauseous. He notes that he uses narcotics, drinks alcohol occasionally.  He is unaware of prior overdose events.    Home Medications Prior to Admission medications   Medication Sig Start Date End Date Taking? Authorizing Provider  divalproex (DEPAKOTE ER) 250 MG 24 hr tablet Take 1 tablet (250 mg total) by mouth at bedtime. 09/20/20  Yes Sharma Covert, MD  doxycycline (VIBRA-TABS) 100 MG tablet Take 1 tablet (100 mg total) by mouth every 12 (twelve) hours. 09/20/20  Yes Sharma Covert, MD  hydrOXYzine (ATARAX/VISTARIL) 25 MG tablet Take 1 tablet (25 mg total) by mouth 3 (three) times daily as needed for anxiety. 09/20/20  Yes Sharma Covert, MD  traZODone (DESYREL) 50 MG tablet Take 1 tablet (50 mg total) by mouth at bedtime as needed for sleep. 09/20/20  Yes Clary, Cordie Grice, MD  OLANZapine zydis (ZYPREXA) 20 MG disintegrating tablet Take 1 tablet (20 mg total) by mouth at bedtime. 09/20/20   Sharma Covert, MD      Allergies    Patient has no known allergies.    Review of Systems   Review of Systems  All other systems reviewed and are  negative.  Physical Exam Updated Vital Signs BP (!) 133/98 (BP Location: Left Arm)    Pulse (!) 101    Temp 97.6 F (36.4 C) (Oral)    Resp 12    Ht 5\' 11"  (1.803 m)    Wt 72.6 kg    SpO2 97%    BMI 22.32 kg/m  Physical Exam Vitals and nursing note reviewed.  Constitutional:      General: He is not in acute distress.    Appearance: He is well-developed.  HENT:     Head: Normocephalic and atraumatic.  Eyes:     Conjunctiva/sclera: Conjunctivae normal.  Cardiovascular:     Rate and Rhythm: Regular rhythm. Tachycardia present.  Pulmonary:     Effort: Pulmonary effort is normal. No respiratory distress.     Breath sounds: No stridor.  Abdominal:     General: There is no distension.  Skin:    General: Skin is warm and dry.  Neurological:     Mental Status: He is alert and oriented to person, place, and time.  Psychiatric:        Mood and Affect: Mood normal.        Behavior: Behavior normal.    ED Results / Procedures / Treatments   Labs (all labs ordered are listed, but only abnormal results are displayed) Labs Reviewed  COMPREHENSIVE METABOLIC PANEL - Abnormal; Notable for the following components:  Result Value   Potassium 5.7 (*)    Calcium 8.7 (*)    All other components within normal limits  ETHANOL - Abnormal; Notable for the following components:   Alcohol, Ethyl (B) 143 (*)    All other components within normal limits  SALICYLATE LEVEL - Abnormal; Notable for the following components:   Salicylate Lvl <1.6 (*)    All other components within normal limits  ACETAMINOPHEN LEVEL - Abnormal; Notable for the following components:   Acetaminophen (Tylenol), Serum <10 (*)    All other components within normal limits  CBC - Abnormal; Notable for the following components:   WBC 21.0 (*)    All other components within normal limits  RESP PANEL BY RT-PCR (FLU A&B, COVID) ARPGX2  RAPID URINE DRUG SCREEN, HOSP PERFORMED  CBG MONITORING, ED      Procedures Procedures    Medications Ordered in ED Medications  ondansetron (ZOFRAN) injection 4 mg (has no administration in time range)    ED Course/ Medical Decision Making/ A&P  This patient presents to the ED for concern of possible overdose, unresponsiveness, this involves an extensive number of treatment options, and is a complaint that carries with it a high risk of complications and morbidity.  The differential diagnosis includes overdose, arrhythmia, electrolyte abnormalities, infection   Co morbidities that complicate the patient evaluation  Youth, polysubstance abuse   Social Determinants of Health:  Use, polysubstance   Additional history obtained:  Additional history and/or information obtained from EMS External records from outside source obtained and reviewed including as above, patient required Narcan, was unresponsive on arrival, combative subsequently   After the initial evaluation, orders, including: Labs, fluid resuscitation, antiemetics were initiated.  Patient placed on Cardiac and Pulse-Oximetry Monitors. The patient was maintained on a cardiac monitor.  The cardiac monitored showed an rhythm of sinus rhythm, sinus tach, 90, 110, borderline The patient was also maintained on pulse oximetry. The readings were typically 99% room air  9:57 AM Patient nauseous                         On repeat evaluation of the patient improved  Lab Tests:  I personally interpreted labs.  The pertinent results include: Hyperkalemia, alcohol 143, leukocytosis, positive THC on drug screen  2:22 PM Patient awake, alert, ambulatory, walking about the room.  He has been observed for 5 hours after likely overdose, has no current complaints, has had no evidence for decompensation, though his labs were abnormal following resuscitation, these are likely secondary to overdose, to which the patient admits.  With no ongoing complaints, no hemodynamic stability, monitoring  for several hours without decompensation, the patient is appropriate for discharge.  Given the reported CPR, hypoxia, hospitalization was a consideration, but given the progression of time without decompensation, not currently indicated.  Final Clinical Impression(s) / ED Diagnoses Final diagnoses:  Accidental overdose, initial encounter     Carmin Muskrat, MD 11/26/21 1424

## 2021-11-26 NOTE — Discharge Instructions (Addendum)
With today's episode of apparent overdose it is very importantly follow-up with your physician for consideration of assistance with substance abuse disorder treatment.  Return here for concerning changes in your condition.

## 2021-12-30 ENCOUNTER — Emergency Department (HOSPITAL_COMMUNITY)
Admission: EM | Admit: 2021-12-30 | Discharge: 2021-12-31 | Disposition: A | Discharge status: Home / Self Care | Payer: Medicaid Other | Attending: Emergency Medicine | Admitting: Emergency Medicine

## 2021-12-30 ENCOUNTER — Other Ambulatory Visit: Payer: Self-pay

## 2021-12-30 DIAGNOSIS — R109 Unspecified abdominal pain: Secondary | ICD-10-CM | POA: Diagnosis not present

## 2021-12-30 DIAGNOSIS — R197 Diarrhea, unspecified: Secondary | ICD-10-CM | POA: Diagnosis not present

## 2021-12-30 DIAGNOSIS — F17210 Nicotine dependence, cigarettes, uncomplicated: Secondary | ICD-10-CM | POA: Insufficient documentation

## 2021-12-30 DIAGNOSIS — R112 Nausea with vomiting, unspecified: Secondary | ICD-10-CM | POA: Insufficient documentation

## 2021-12-31 ENCOUNTER — Encounter (HOSPITAL_COMMUNITY): Payer: Self-pay | Admitting: Emergency Medicine

## 2021-12-31 LAB — COMPREHENSIVE METABOLIC PANEL
ALT: 20 U/L (ref 0–44)
AST: 21 U/L (ref 15–41)
Albumin: 5.4 g/dL — ABNORMAL HIGH (ref 3.5–5.0)
Alkaline Phosphatase: 70 U/L (ref 38–126)
Anion gap: 10 (ref 5–15)
BUN: 17 mg/dL (ref 6–20)
CO2: 22 mmol/L (ref 22–32)
Calcium: 10.1 mg/dL (ref 8.9–10.3)
Chloride: 104 mmol/L (ref 98–111)
Creatinine, Ser: 0.87 mg/dL (ref 0.61–1.24)
GFR, Estimated: >60 mL/min (ref >60)
Glucose, Bld: 122 mg/dL — ABNORMAL HIGH (ref 70–99)
Potassium: 3.9 mmol/L (ref 3.5–5.1)
Sodium: 136 mmol/L (ref 135–145)
Total Bilirubin: 1 mg/dL (ref 0.3–1.2)
Total Protein: 8.8 g/dL — ABNORMAL HIGH (ref 6.5–8.1)

## 2021-12-31 LAB — CBC
HCT: 47.7 % (ref 39.0–52.0)
Hemoglobin: 17.2 g/dL — ABNORMAL HIGH (ref 13.0–17.0)
MCH: 33.2 pg (ref 26.0–34.0)
MCHC: 36.1 g/dL — ABNORMAL HIGH (ref 30.0–36.0)
MCV: 92.1 fL (ref 80.0–100.0)
Platelets: 166 10*3/uL (ref 150–400)
RBC: 5.18 MIL/uL (ref 4.22–5.81)
RDW: 12.1 % (ref 11.5–15.5)
WBC: 15.9 10*3/uL — ABNORMAL HIGH (ref 4.0–10.5)
nRBC: 0 % (ref 0.0–0.2)

## 2021-12-31 LAB — SALICYLATE LEVEL: Salicylate Lvl: <7 mg/dL — ABNORMAL LOW (ref 7.0–30.0)

## 2021-12-31 LAB — LIPASE, BLOOD: Lipase: 22 U/L (ref 11–51)

## 2021-12-31 LAB — ACETAMINOPHEN LEVEL: Acetaminophen (Tylenol), Serum: <10 ug/mL — ABNORMAL LOW (ref 10–30)

## 2021-12-31 LAB — ETHANOL: Alcohol, Ethyl (B): <10 mg/dL (ref <10)

## 2021-12-31 MED ORDER — ONDANSETRON HCL 4 MG/2ML IJ SOLN
4.0000 mg | Freq: Once | INTRAMUSCULAR | Status: AC
  Administered 2021-12-31: 4 mg via INTRAVENOUS
  Filled 2021-12-31: qty 2

## 2021-12-31 MED ORDER — ONDANSETRON 4 MG PO TBDP: 4.0000 mg | ORAL_TABLET | Freq: Three times a day (TID) | ORAL | 0 refills | Status: DC | PRN

## 2021-12-31 MED ORDER — SODIUM CHLORIDE 0.9 % IV BOLUS
1000.0000 mL | Freq: Once | INTRAVENOUS | Status: AC
  Administered 2021-12-31: 1000 mL via INTRAVENOUS

## 2021-12-31 NOTE — ED Triage Notes (Addendum)
Pt states he took 12 yellow percocet's yesterday with 1 xanax bar, just trying to get high. Pt states today he has been having abd pain with vomiting.  ? ?Pt concerned that the pills may have been fake and were actually fentanyl. ?

## 2021-12-31 NOTE — ED Provider Notes (Signed)
?[place] DEPT ?[facility] Emergency Department ?Provider Note ?MRN:  [id number]  ?Arrival date & time: 12/31/21    ? ?Chief Complaint   ?Abdominal Pain ?  ?History of Present Illness   ?Edward [name] is a 21 y.o. year-old male with a history of ADHD presenting to the ED with chief complaint of abdominal pain. ? ?Nausea vomiting and diarrhea all day today.  Had some abdominal cramping earlier today but this is gone away.  Feels like he is dehydrated.  Was doing some drugs yesterday. ? ?Review of Systems  ?A thorough review of systems was obtained and all systems are negative except as noted in the HPI and PMH.  ? ?Patient's Health History   ? ?Past Medical History:  ?Diagnosis Date  ? ADHD (attention deficit hyperactivity disorder)   ?  ?Past Surgical History:  ?Procedure Laterality Date  ? tubes in ears    ?  ?History reviewed. No pertinent family history.  ?Social History  ? ?Socioeconomic History  ? Marital status: Single  ?  Spouse name: Not on file  ? Number of children: Not on file  ? Years of education: Not on file  ? Highest education level: Not on file  ?Occupational History  ? Not on file  ?Tobacco Use  ? Smoking status: Every Day  ?  Packs/day: 0.50  ?  Types: Cigarettes  ? Smokeless tobacco: Never  ?Vaping Use  ? Vaping Use: Some days  ?Substance and Sexual Activity  ? Alcohol use: Not Currently  ?  Comment: occ  ? Drug use: Yes  ?  Frequency: 2.0 times per week  ?  Types: Marijuana  ? Sexual activity: Not on file  ?Other Topics Concern  ? Not on file  ?Social History Narrative  ? Not on file  ? ?Social Determinants of Health  ? ?Financial Resource Strain: Not on file  ?Food Insecurity: Not on file  ?Transportation Needs: Not on file  ?Physical Activity: Not on file  ?Stress: Not on file  ?Social Connections: Not on file  ?Intimate Partner Violence: Not on file  ?  ? ?Physical Exam  ? ?Vitals:  ? 12/31/21 0057 12/31/21 0200  ?BP: (!) 144/99 (!) 130/94  ?Pulse: (!) 121 (!) 103  ?Resp: 18 17   ?Temp:    ?SpO2: 100% 100%  ?  ?CONSTITUTIONAL: Well-appearing, NAD ?NEURO/PSYCH:  Alert and oriented x 3, no focal deficits ?EYES:  eyes equal and reactive ?ENT/NECK:  no LAD, no JVD ?CARDIO: Tachycardic rate, well-perfused, normal S1 and S2 ?PULM:  CTAB no wheezing or rhonchi ?GI/GU:  non-distended, non-tender ?MSK/SPINE:  No gross deformities, no edema ?SKIN:  no rash, atraumatic ? ? ?*Additional and/or pertinent findings included in MDM below ? ?Diagnostic and Interventional Summary  ? ? EKG Interpretation ? ?Date/Time:  Thursday December 31 2021 01:14:34 EDT ?Ventricular Rate:  98 ?PR Interval:  129 ?QRS Duration: 92 ?QT Interval:  357 ?QTC Calculation: 456 ?R Axis:   87 ?Text Interpretation: Sinus tachycardia Ventricular trigeminy LAE, consider biatrial enlargement Probable left ventricular hypertrophy ST elev, probable normal early repol pattern Baseline wander in lead(s) V3 Confirmed by Gerlene Fee [phone]) on 12/31/2021 1:40:28 AM ?  ? ?  ? ?Labs Reviewed  ?CBC - Abnormal; Notable for the following components:  ?    Result Value  ? WBC 15.9 (*)   ? Hemoglobin 17.2 (*)   ? MCHC 36.1 (*)   ? All other components within normal limits  ?COMPREHENSIVE METABOLIC PANEL - Abnormal;  Notable for the following components:  ? Glucose, Bld 122 (*)   ? Total Protein 8.8 (*)   ? Albumin 5.4 (*)   ? All other components within normal limits  ?ACETAMINOPHEN LEVEL - Abnormal; Notable for the following components:  ? Acetaminophen (Tylenol), Serum <10 (*)   ? All other components within normal limits  ?SALICYLATE LEVEL - Abnormal; Notable for the following components:  ? Salicylate Lvl <1.0 (*)   ? All other components within normal limits  ?LIPASE, BLOOD  ?ETHANOL  ?  ?No orders to display  ?  ?Medications  ?sodium chloride 0.9 % bolus 1,000 mL (0 mLs Intravenous Stopped 12/31/21 0210)  ?ondansetron [facility]) injection 4 mg (4 mg Intravenous Given 12/31/21 0109)  ?  ? ?Procedures  /  Critical Care ?Procedures ? ?ED Course and  Medical Decision Making  ?Initial Impression and Ddx ?Suspect viral illness, abdomen soft and nontender with no rebound guarding or rigidity, providing fluids, obtaining screening labs and will reassess. ? ?Past medical/surgical history that increases complexity of ED encounter: None ? ?Interpretation of Diagnostics ?I personally reviewed the EKG and my interpretation is as follows: Sinus rhythm with frequent PVC ?   ?Labs are reassuring with no significant blood count or electrolyte disturbance ? ?Patient Reassessment and Ultimate Disposition/Management ?On reassessment patient feeling a lot better, continues to have a benign abdomen, appropriate for discharge. ? ?Patient management required discussion with the following services or consulting groups:  None ? ?Complexity of Problems Addressed ?Acute illness or injury that poses threat of life of bodily function ? ?Additional Data Reviewed and Analyzed ?Further history obtained from: ?Further history from spouse/family member ? ?Additional Factors Impacting ED Encounter Risk ?Prescriptions ? ?Barth Kirks. Sedonia Small, MD ?[facility] Emergency Medicine ?[email or web address] ? ?Final Clinical Impressions(s) / ED Diagnoses  ? ?  ICD-10-CM   ?1. Nausea vomiting and diarrhea  R11.2   ? R19.7   ?  ?  ?ED Discharge Orders   ? ?      Ordered  ?  ondansetron (ZOFRAN-ODT) 4 MG disintegrating tablet  Every 8 hours PRN       ? 12/31/21 0211  ? ?  ?  ? ?  ?  ? ?Discharge Instructions Discussed with and Provided to Patient:  ? ? ? ?Discharge Instructions   ? ?  ?You were evaluated in the Emergency Department and after careful evaluation, we did not find any emergent condition requiring admission or further testing in the hospital. ? ?Your exam/testing today was overall reassuring.  Symptoms likely due to a GI bug from a virus.  Can use the Zofran at home as needed for nausea. ? ?Please return to the Emergency Department if you experience any worsening of your  condition.  Thank you for allowing [place] to be a part of your care. ? ? ? ? ? ?  ?Maudie Flakes, MD ?12/31/21 [phone] ? ?

## 2021-12-31 NOTE — Discharge Instructions (Signed)
You were evaluated in the Emergency Department and after careful evaluation, we did not find any emergent condition requiring admission or further testing in the hospital. ? ?Your exam/testing today was overall reassuring.  Symptoms likely due to a GI bug from a virus.  Can use the Zofran at home as needed for nausea. ? ?Please return to the Emergency Department if you experience any worsening of your condition.  Thank you for allowing [place] to be a part of your care. ? ?

## 2022-10-06 ENCOUNTER — Other Ambulatory Visit: Payer: Self-pay

## 2022-10-06 ENCOUNTER — Emergency Department (HOSPITAL_COMMUNITY)
Admission: EM | Admit: 2022-10-06 | Discharge: 2022-10-07 | Disposition: A | Discharge status: Home / Self Care | Payer: Medicaid Other | Attending: Emergency Medicine | Admitting: Emergency Medicine

## 2022-10-06 DIAGNOSIS — F909 Attention-deficit hyperactivity disorder, unspecified type: Secondary | ICD-10-CM | POA: Insufficient documentation

## 2022-10-06 DIAGNOSIS — R451 Restlessness and agitation: Secondary | ICD-10-CM | POA: Diagnosis not present

## 2022-10-06 DIAGNOSIS — F10129 Alcohol abuse with intoxication, unspecified: Secondary | ICD-10-CM | POA: Diagnosis present

## 2022-10-06 DIAGNOSIS — F1410 Cocaine abuse, uncomplicated: Secondary | ICD-10-CM | POA: Diagnosis not present

## 2022-10-06 DIAGNOSIS — F1510 Other stimulant abuse, uncomplicated: Secondary | ICD-10-CM | POA: Insufficient documentation

## 2022-10-06 DIAGNOSIS — Y908 Blood alcohol level of 240 mg/100 ml or more: Secondary | ICD-10-CM | POA: Diagnosis not present

## 2022-10-06 DIAGNOSIS — F1010 Alcohol abuse, uncomplicated: Secondary | ICD-10-CM

## 2022-10-06 DIAGNOSIS — Z1152 Encounter for screening for COVID-19: Secondary | ICD-10-CM | POA: Diagnosis not present

## 2022-10-06 LAB — RAPID URINE DRUG SCREEN, HOSP PERFORMED
Amphetamines: NOT DETECTED
Barbiturates: NOT DETECTED
Benzodiazepines: NOT DETECTED
Cocaine: NOT DETECTED
Opiates: NOT DETECTED
Tetrahydrocannabinol: POSITIVE — AB

## 2022-10-06 LAB — CBC WITH DIFFERENTIAL/PLATELET
Abs Immature Granulocytes: 0.02 10*3/uL (ref 0.00–0.07)
Basophils Absolute: 0 10*3/uL (ref 0.0–0.1)
Basophils Relative: 0 %
Eosinophils Absolute: 0.1 10*3/uL (ref 0.0–0.5)
Eosinophils Relative: 1 %
HCT: 47.6 % (ref 39.0–52.0)
Hemoglobin: 16 g/dL (ref 13.0–17.0)
Immature Granulocytes: 0 %
Lymphocytes Relative: 43 %
Lymphs Abs: 3.3 10*3/uL (ref 0.7–4.0)
MCH: 31 pg (ref 26.0–34.0)
MCHC: 33.6 g/dL (ref 30.0–36.0)
MCV: 92.2 fL (ref 80.0–100.0)
Monocytes Absolute: 0.6 10*3/uL (ref 0.1–1.0)
Monocytes Relative: 8 %
Neutro Abs: 3.7 10*3/uL (ref 1.7–7.7)
Neutrophils Relative %: 48 %
Platelets: 187 10*3/uL (ref 150–400)
RBC: 5.16 MIL/uL (ref 4.22–5.81)
RDW: 12.7 % (ref 11.5–15.5)
WBC: 7.8 10*3/uL (ref 4.0–10.5)
nRBC: 0 % (ref 0.0–0.2)

## 2022-10-06 LAB — COMPREHENSIVE METABOLIC PANEL
ALT: 13 U/L (ref 0–44)
AST: 15 U/L (ref 15–41)
Albumin: 5 g/dL (ref 3.5–5.0)
Alkaline Phosphatase: 68 U/L (ref 38–126)
Anion gap: 13 (ref 5–15)
BUN: 11 mg/dL (ref 6–20)
CO2: 21 mmol/L — ABNORMAL LOW (ref 22–32)
Calcium: 9.4 mg/dL (ref 8.9–10.3)
Chloride: 108 mmol/L (ref 98–111)
Creatinine, Ser: 0.87 mg/dL (ref 0.61–1.24)
GFR, Estimated: >60 mL/min (ref >60)
Glucose, Bld: 101 mg/dL — ABNORMAL HIGH (ref 70–99)
Potassium: 4 mmol/L (ref 3.5–5.1)
Sodium: 142 mmol/L (ref 135–145)
Total Bilirubin: 0.4 mg/dL (ref 0.3–1.2)
Total Protein: 8.6 g/dL — ABNORMAL HIGH (ref 6.5–8.1)

## 2022-10-06 LAB — ETHANOL: Alcohol, Ethyl (B): 278 mg/dL — ABNORMAL HIGH (ref <10)

## 2022-10-06 LAB — ACETAMINOPHEN LEVEL: Acetaminophen (Tylenol), Serum: <10 ug/mL — ABNORMAL LOW (ref 10–30)

## 2022-10-06 NOTE — ED Provider Notes (Cosign Needed)
I received this patient in handoff from [facility], please see her note for original history and workup thus far.  In short patient is a 22 year old male who presented today intoxicated.  At this time the plan is for him to metabolize and be discharged after he is able to ambulate   Physical Exam  BP 118/66   Pulse 92   Temp 98.2 F (36.8 C) (Oral)   Resp 16   Ht '5\' 11"'$  (1.803 m)   Wt 68 kg   SpO2 98%   BMI 20.91 kg/m   Physical Exam Vitals and nursing note reviewed.  Constitutional:      Appearance: Normal appearance.  HENT:     Head: Normocephalic and atraumatic.  Eyes:     General: No scleral icterus.    Conjunctiva/sclera: Conjunctivae normal.  Pulmonary:     Effort: Pulmonary effort is normal. No respiratory distress.  Skin:    Findings: No rash.  Neurological:     Mental Status: He is alert.  Psychiatric:        Mood and Affect: Mood normal.     Procedures  Procedures  ED Course / MDM   Clinical Course as of 10/06/22 1515  Wed Oct 06, 2022  1116 Acetaminophen (Tylenol), S(!): <10 Within normal limits [HS]  1116 Ethanol(!) Elevated, consistent with patient's clear intoxication [HS]  1118 Comprehensive metabolic panel(!) No gross electrolyte derangement, AKI or transaminitis [HS]  1241 CBC with Differential No leukocytosis or anemia [HS]  1420 I reevaluated patient, resting comfortably and asleep in hallway bed. [HS]    Clinical Course User Index [HS] Sherrill Raring, PA-C   Medical Decision Making Amount and/or Complexity of Data Reviewed Labs: ordered. Decision-making details documented in ED Course.   4:20pm: I went and reevaluated the patient.  He was able to recall why he was in the emergency department, is alert and oriented and ambulatory in the room.  He says that he would prefer not to stay in the emergency department.  He denies SI/HI and does not appear to be responding to any internal stimuli.  Believe reversing his IVC is reasonable  4:30pm:  I spoke with patient's mother who is concerned about patient being discharged.  She says that he needs rehab due to his substance use and has a questionable history of schizophrenia.  She says that he needs medication for this because she thinks that it is the reason that he is using so heavily.  Additionally she says that he sends her text messages and hugs her saying that he loves her and he is sorry and she believes that this is though it is him saying that he plans to hurt himself.  She says that he knows how to "get out of everything" and she would prefer to not be discharged.  Psych evaluation placed at this time.  Clinically sober and medically cleared  9P: I again spoke with the patient's mother and updated her that we are still awaiting TTS evaluation.  She says that she has been calling different rehab facilities for patient's substance use and mental health problems.  She says that she hopes we are able to hold him until they are able to find somewhere to be placed.  TTS to dispo.  Please keep mother up-to-date of all plans.  Patient reports that it is fine to speak with his mother.   Darliss Ridgel 10/06/22 2141    Ezequiel Essex, MD 10/07/22 7510    Mervyn Gay  A, PA-C 10/07/22 1149    Ezequiel Essex, MD 10/07/22 1512

## 2022-10-06 NOTE — ED Provider Notes (Signed)
[facility] DEPT Provider Note   CSN: [id number] Arrival date & time: 10/06/22  3295     History  Chief Complaint  Patient presents with   Mental Health Problem    Edward [name] is a 22 y.o. male.   Mental Health Problem    Patient with medical history of ADHD, polysubstance use disorder, marijuana use disorder, benzodiazepine abuse, cocaine abuse, amphetamine abuse presents to the ED via [employer] due to erratic behavior.  Patient reportedly was trying to break into his mother's house, GDP left before giving report.  Patient states he "did a bunch of 30s and was worried he was going to be sevened up".  Home Medications Prior to Admission medications   Medication Sig Start Date End Date Taking? Authorizing Provider  hydrOXYzine (ATARAX/VISTARIL) 25 MG tablet Take 1 tablet (25 mg total) by mouth 3 (three) times daily as needed for anxiety. 09/20/20   Sharma Covert, MD  Melatonin 10 MG TABS Take 10 mg by mouth at bedtime.    [provider]  ondansetron (ZOFRAN-ODT) 4 MG disintegrating tablet Take 1 tablet (4 mg total) by mouth every 8 (eight) hours as needed for nausea or vomiting. 12/31/21   Maudie Flakes, MD      Allergies    Patient has no known allergies.    Review of Systems   Review of Systems  Physical Exam Updated Vital Signs BP 118/66   Pulse 92   Temp 98.2 F (36.8 C) (Oral)   Resp 16   Ht '5\' 11"'$  (1.803 m)   Wt 68 kg   SpO2 98%   BMI 20.91 kg/m  Physical Exam Vitals and nursing note reviewed. Exam conducted with a chaperone present.  Constitutional:      Appearance: Normal appearance.     Comments: Tangential speech, ambulatory with a steady gait  HENT:     Head: Normocephalic and atraumatic.  Eyes:     General: No scleral icterus.       Right eye: No discharge.        Left eye: No discharge.     Extraocular Movements: Extraocular movements intact.     Pupils: Pupils are equal, round, and reactive to light.   Cardiovascular:     Rate and Rhythm: Normal rate and regular rhythm.     Pulses: Normal pulses.     Heart sounds: Normal heart sounds. No murmur heard.    No friction rub. No gallop.  Pulmonary:     Effort: Pulmonary effort is normal. No respiratory distress.     Breath sounds: Normal breath sounds.  Abdominal:     General: Abdomen is flat. Bowel sounds are normal. There is no distension.     Palpations: Abdomen is soft.     Tenderness: There is no abdominal tenderness.  Skin:    General: Skin is warm and dry.     Coloration: Skin is not jaundiced.  Neurological:     Mental Status: He is alert. Mental status is at baseline.     Coordination: Coordination normal.     ED Results / Procedures / Treatments   Labs (all labs ordered are listed, but only abnormal results are displayed) Labs Reviewed  COMPREHENSIVE METABOLIC PANEL - Abnormal; Notable for the following components:      Result Value   CO2 21 (*)    Glucose, Bld 101 (*)    Total Protein 8.6 (*)    All other components within normal limits  ACETAMINOPHEN LEVEL - Abnormal; Notable for the following components:   Acetaminophen (Tylenol), Serum <10 (*)    All other components within normal limits  ETHANOL - Abnormal; Notable for the following components:   Alcohol, Ethyl (B) 278 (*)    All other components within normal limits  CBC WITH DIFFERENTIAL/PLATELET    EKG None  Radiology No results found.  Procedures Procedures    Medications Ordered in ED Medications - No data to display  ED Course/ Medical Decision Making/ A&P Clinical Course as of 10/06/22 1441  Wed Oct 06, 2022  1116 Acetaminophen (Tylenol), S(!): <10 Within normal limits [HS]  1116 Ethanol(!) Elevated, consistent with patient's clear intoxication [HS]  1118 Comprehensive metabolic panel(!) No gross electrolyte derangement, AKI or transaminitis [HS]  1241 CBC with Differential No leukocytosis or anemia [HS]  1420 I reevaluated patient,  resting comfortably and asleep in hallway bed. [HS]    Clinical Course User Index [HS] Sherrill Raring, PA-C                           Medical Decision Making Amount and/or Complexity of Data Reviewed Labs: ordered. Decision-making details documented in ED Course.   Patient presents to the emergency department agitated, intoxicated, behaving erratically.  I suspect clinically he is intoxicated versus substance use.  He is telling me he took Percocet prior to arrival, if he actually took 48 Percocet I would be concerned about Tylenol overdose will definitely be checking a level as well as basic labs.  I ordered, viewed and interpreted laboratory workup.  See ED course.  Laboratory workup is confirmatory that patient is intoxicated with an ethanol of 273.  Will monitor until patient metabolizes to freedom.  Once patient is clinically more sober I do think he would be appropriate for IVC being revoked and discharged back into the community.  Will continue to monitor until patient metabolizes the ethanol in his system and reevaluation.  Patient care signed out to oncoming team.  PA [place] to follow.  Patient needs to be ambulated, if able to ambulate with steady gait and clinically more sober feel appropriative for discharge into the community.  I do not think patient needs to be IVC, do not think he needs TTS evaluation.        Final Clinical Impression(s) / ED Diagnoses Final diagnoses:  Alcohol abuse    Rx / DC Orders ED Discharge Orders     None         Sherrill Raring, PA-C 10/06/22 Parker, [place], DO 10/08/22 [phone]

## 2022-10-06 NOTE — ED Notes (Signed)
Edward [name] (mother): [phone] wants to be updated on placement of pt.

## 2022-10-06 NOTE — ED Triage Notes (Signed)
Pt dropped off at ED by [employer]. Pt was picked up at family members home by [facility].

## 2022-10-06 NOTE — ED Notes (Addendum)
Patient dressed out into purple scrubs. Patient also has a sweatshirt, shorts and socks in patient belonging bag. No cell phone or other devices.

## 2022-10-07 LAB — SARS CORONAVIRUS 2 (TAT 6-24 HRS): SARS Coronavirus 2: NEGATIVE

## 2022-10-07 NOTE — ED Provider Notes (Signed)
Patient was seen earlier yesterday for erratic behavior, patient was worked up showing cannabis as well as an elevated ethanol level, provider who saw him initially  felt that once he metabolize he is able to be discharged home.  Patient was handed off to another provider due to shift change  This provider spoke with family who was concerned that patient was suicidal.  took out IVC paperwork.  TTS had evaluated the patient and psychiatrist Don Perking has psychiatry cleared the patient, on my assessment patient I agree with this, he is not endorsing any suicidal homicidal ideations, he does not appear to be responding to internal stimuli.  Patient states that he was up late the night prior as he was playing Xbox, he states that he noticed in his mother's refrigerator there was wine and he decided to drink it.  This was not an attempt to hurt himself.  He states that he feels safe and comfortable to be discharged at this time he is agreement this plan.  I have reviewed patient's lab work CBC is unremarkable, initial ethanol level was 278, acetaminophen was negative, CMP was unremarkable, rapid drug positive for cannabis.  Patient is back to his baseline tolerating p.o. will discharge home.     Marcello Fennel, PA-C 10/07/22 0151    Maudie Flakes, MD 10/07/22 [phone]

## 2022-10-07 NOTE — ED Notes (Signed)
Pt currently is asleep waiting COVID test results for possible admission while awake behavior is self controlled his affect was flat brightens with interaction mood stable and vital signs WNL. Pt ambulates to BR with sta
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
## 2022-10-07 NOTE — Discharge Instructions (Addendum)
Please limit your alcohol consumption, if you find you are having difficulty managing this I have given you resources for outpatient assistance.  You may also follow-up at the [facility] behavioral health center.  Come back to the emergency department if you develop chest pain, shortness of breath, severe abdominal pain, uncontrolled nausea, vomiting, diarrhea.

## 2023-07-06 ENCOUNTER — Encounter (HOSPITAL_COMMUNITY): Payer: Self-pay | Admitting: *Deleted

## 2023-07-06 ENCOUNTER — Ambulatory Visit (HOSPITAL_COMMUNITY)
Admission: EM | Admit: 2023-07-06 | Discharge: 2023-07-06 | Disposition: A | Discharge status: Home / Self Care | Payer: 59 | Attending: Family Medicine | Admitting: Family Medicine

## 2023-07-06 DIAGNOSIS — R5383 Other fatigue: Secondary | ICD-10-CM

## 2023-07-06 DIAGNOSIS — R110 Nausea: Secondary | ICD-10-CM

## 2023-07-06 LAB — POCT FASTING CBG KUC MANUAL ENTRY: POCT Glucose (KUC): 97 mg/dL (ref 70–99)

## 2023-07-06 MED ORDER — ONDANSETRON 4 MG PO TBDP
ORAL_TABLET | ORAL | Status: AC
  Filled 2023-07-06: qty 1

## 2023-07-06 MED ORDER — ONDANSETRON 4 MG PO TBDP
4.0000 mg | ORAL_TABLET | Freq: Once | ORAL | Status: AC
  Administered 2023-07-06: 4 mg via ORAL

## 2023-07-06 MED ORDER — FAMOTIDINE 40 MG PO TABS: 40.0000 mg | ORAL_TABLET | Freq: Every day | ORAL | 0 refills | Status: AC

## 2023-07-06 MED ORDER — ONDANSETRON 4 MG PO TBDP: 4.0000 mg | ORAL_TABLET | Freq: Three times a day (TID) | ORAL | 0 refills | Status: AC | PRN

## 2023-07-06 NOTE — ED Provider Notes (Signed)
[facility]   [id number] 07/06/23 Arrival Time: 1322  ASSESSMENT & PLAN:  1. Nausea   2. Other fatigue    Unclear etiology. Possibly GERD. Discussed. Declines lab work today. CBG WNL. Normal PO intake.   Trial of: Meds ordered this encounter  Medications   ondansetron (ZOFRAN-ODT) disintegrating tablet 4 mg   ondansetron (ZOFRAN-ODT) 4 MG disintegrating tablet    Sig: Take 1 tablet (4 mg total) by mouth every 8 (eight) hours as needed for nausea or vomiting.    Dispense:  15 tablet    Refill:  0   famotidine (PEPCID) 40 MG tablet    Sig: Take 1 tablet (40 mg total) by mouth daily.    Dispense:  30 tablet    Refill:  0     Follow-up Information     [facility], [employer].   Specialty: Family Medicine Why: As needed. Contact information: [address] [place] [address] [phone]                 Reviewed expectations re: course of current medical issues. Questions answered. Outlined signs and symptoms indicating need for more acute intervention. Patient verbalized understanding. After Visit Summary given.   SUBJECTIVE: History from: patient. Edward [name] is a 22 y.o. male who presents with complaint of nausea; on/off; few weeks; worse in mornings. Normal PO intake without diarrhea/emesis. Has noted mild fatigue for a few days. Otherwise well.    OBJECTIVE:  Vitals:   07/06/23 1350  BP: 114/69  Pulse: 72  Resp: 18  Temp: 98.4 F (36.9 C)  TempSrc: Oral  SpO2: 97%    General appearance: alert; no distress Eyes: PERRLA; EOMI; conjunctiva normal HENT: normocephalic; atraumatic; TMs normal; nasal mucosa normal; oral mucosa normal Neck: supple  Lungs: clear to auscultation bilaterally; unlabored Heart: regular rate and rhythm without murmer Abdomen: soft, non-tender Skin: warm and dry Neurologic: normal gait; normal symmetric reflexes Psychological: alert and cooperative; normal mood and  affect  Labs: Results for orders placed or performed during the hospital encounter of 07/06/23  POC CBG monitoring  Result Value Ref Range   POCT Glucose ([facility]) 97 70 - 99 mg/dL   Labs Reviewed  POCT FASTING CBG [facility] MANUAL ENTRY     No Known Allergies  Past Medical History:  Diagnosis Date   ADHD (attention deficit hyperactivity disorder)    Social History   Socioeconomic History   Marital status: Single    Spouse name: Not on file   Number of children: Not on file   Years of education: Not on file   Highest education level: Not on file  Occupational History   Not on file  Tobacco Use   Smoking status: Every Day    Current packs/day: 0.50    Types: Cigarettes   Smokeless tobacco: Never  Vaping Use   Vaping status: Some Days  Substance and Sexual Activity   Alcohol use: Not Currently    Comment: occ   Drug use: Not Currently    Frequency: 2.0 times per week    Types: Marijuana    Comment: 90 days no drugs or alcohol 07/06/2023   Sexual activity: Yes    Birth control/protection: None  Other Topics Concern   Not on file  Social History Narrative   Not on file   Social Determinants of Health   Financial Resource Strain: Not on file  Food Insecurity: Not on file  Transportation Needs: Not on file  Physical Activity:  Not on file  Stress: Not on file  Social Connections: Not on file  Intimate Partner Violence: Not on file   History reviewed. No pertinent family history. Past Surgical History:  Procedure Laterality Date   tubes in ears       Mardella Layman, MD 07/06/23 1451

## 2023-07-06 NOTE — ED Triage Notes (Signed)
Pt states he has had nausea, fatigue x 2-3 days. He hasn't tried any meds.

## 2024-02-02 ENCOUNTER — Encounter (HOSPITAL_COMMUNITY): Payer: Self-pay

## 2024-02-02 ENCOUNTER — Emergency Department (HOSPITAL_COMMUNITY)
Admission: EM | Admit: 2024-02-02 | Discharge: 2024-02-02 | Disposition: A | Discharge status: Home / Self Care | Payer: Self-pay | Attending: Emergency Medicine | Admitting: Emergency Medicine

## 2024-02-02 DIAGNOSIS — H578A3 Foreign body sensation, bilateral eyes: Secondary | ICD-10-CM | POA: Insufficient documentation

## 2024-02-02 DIAGNOSIS — H5789 Other specified disorders of eye and adnexa: Secondary | ICD-10-CM | POA: Insufficient documentation

## 2024-02-02 MED ORDER — TETRACAINE HCL 0.5 % OP SOLN
2.0000 [drp] | Freq: Once | OPHTHALMIC | Status: AC
  Administered 2024-02-02: 2 [drp] via OPHTHALMIC
  Filled 2024-02-02: qty 4

## 2024-02-02 MED ORDER — TETANUS-DIPHTH-ACELL PERTUSSIS 5-2.5-18.5 LF-MCG/0.5 IM SUSY
0.5000 mL | PREFILLED_SYRINGE | Freq: Once | INTRAMUSCULAR | Status: DC
  Filled 2024-02-02: qty 0.5

## 2024-02-02 MED ORDER — FLUORESCEIN SODIUM 1 MG OP STRP
1.0000 | ORAL_STRIP | Freq: Once | OPHTHALMIC | Status: AC
  Administered 2024-02-02: 1 via OPHTHALMIC
  Filled 2024-02-02: qty 1

## 2024-02-02 NOTE — ED Provider Notes (Signed)
 [place] EMERGENCY DEPARTMENT AT [facility] Provider Note   CSN: [id number] Arrival date & time: 02/02/24  1324     History  Chief Complaint  Patient presents with   Foreign Body in [facility] Edward [name] is a 23 y.o. male history of polysubstance abuse including amphetamine/benzo/cocaine/marijuana, schizophrenia presented after having foreign body sensation in both of his eyes last night.  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  Patient does not wear contacts.  Unknown last tetanus.  Patient dates he was finally went home and took a shower but however after taking shower he states that he rubbed his eyes and thinks that the water  and soap got into his eyes and irritated the metal.  Patient states he cannot see is every time he opens his eyes he sees a bright light.    Home Medications Prior to Admission medications   Medication Sig Start Date End Date Taking? Authorizing Provider  famotidine  (PEPCID ) 40 MG tablet Take 1 tablet (40 mg total) by mouth daily. 07/06/23   Afton Albright, MD  ondansetron  (ZOFRAN -ODT) 4 MG disintegrating tablet Take 1 tablet (4 mg total) by mouth every 8 (eight) hours as needed for nausea or vomiting. 07/06/23   Afton Albright, MD      Allergies    Patient has no known allergies.    Review of Systems   Review of Systems  Physical Exam Updated Vital Signs BP 134/76 (BP Location: Right Arm)   Pulse 62   Temp 97.8 F (36.6 C)   Resp 18   Ht 5\' 11"  (1.803 m)   Wt 68 kg   SpO2 100%   BMI 20.91 kg/m  Physical Exam Constitutional:      General: He is not in acute distress. Eyes:     General: Lids are normal. Lids are everted, no foreign bodies appreciated. Vision grossly intact. Gaze aligned appropriately. No visual field deficit.       Right eye: No foreign body, discharge or hordeolum.        Left eye: No foreign body, discharge or  hordeolum.     Extraocular Movements: Extraocular movements intact.     Conjunctiva/sclera: Conjunctivae normal.     Pupils: Pupils are equal, round, and reactive to light.     Right eye: No corneal abrasion or fluorescein  uptake. Seidel exam negative.     Left eye: No corneal abrasion or fluorescein  uptake. Seidel exam negative.    Slit lamp exam:    Right eye: Anterior chamber quiet.     Left eye: Anterior chamber quiet.     Comments: No erythema or edema noted around the eyes No periorbital ecchymosis noted No obvious foreign body or rust ring  Neurological:     Mental Status: He is alert.     ED Results / Procedures / Treatments   Labs (all labs ordered are listed, but only abnormal results are displayed) Labs Reviewed - No data to display  EKG None  Radiology No results found.  Procedures Procedures    Medications Ordered in ED Medications  Tdap (BOOSTRIX ) injection 0.5 mL (0.5 mLs Intramuscular Patient Refused/Not Given 02/02/24 0816)  fluorescein  ophthalmic strip 1 strip (1 strip Both Eyes Given 02/02/24 0700)  tetracaine  (PONTOCAINE) 0.5 % ophthalmic solution 2 drop (2 drops Both Eyes Given 02/02/24 0700)    ED Course/ Medical Decision Making/ A&P  Medical Decision Making Risk Prescription drug management.   Edward [name] 22 y.o. presented today for eye pain.  Working DDx that I considered at this time includes, but not limited to, preorbital/orbital cellulitis, acute glaucoma, HSV infection, open globe, conjunctivitis, hordeolum/chalazion, FB, CRAO/CRVO.  R/o DDx: preorbital/orbital cellulitis, acute glaucoma, HSV infection, open globe, conjunctivitis, hordeolum/chalazion, FB, CRAO/CRVO: These are considered less likely due to history of present illness, physical exam, lab/imaging findings.  Review of prior external notes: 04/19/2022 unknown  Unique Tests and My Independent Interpretation:  None  Social Determinants of Health:  none  Discussion with Independent Historian:  Significant other  Discussion of Management of Tests: None  Risk: Medium: prescription drug management  Risk Stratification Score: none  Staffed with Efraim Grange, MD  Plan: On exam patient was in no acute distress but appears uncomfortable with stable vitals. Patient does not wear contacts.  On exam patient has watery discharge out of both eyes and does have sensitivity to light bilaterally.  I cannot grossly appreciate any foreign bodies or corneal abnormalities however will use tetracaine  and stained the eye along with use of slit-lamp to look for foreign bodies.  Tetanus is out of date and so we will update this as well in case patient does have foreign body in his eye.  Patient refused tetanus as he does not want to be stuck with needles.  Patient does understand that this could lead to further complications down the road and states he does not want to have a needle in him and so we will avoid Tdap at this time.  Patient is aware that he is out of date on his tetanus.  With the slit-lamp the attending and I both do not see any foreign bodies.  Patient stated to the attending that galvanized smoke is what went into his eye and so we will get a pH to make sure this is within range and if in range we will discharge with ophthalmologic follow-up.  Fluorescein  stain was conducted that does not show any Seidel sign, foreign bodies, abrasions or other concerning features.  Patient's visual acuity is also reassuring.  Patient's pH and bilateral eyes was between 7 and 8 which is reassuring.  Patient states he feels okay being discharged.  Will discharge with ophthalmology follow-up.  I did recommend that he use saline eyedrops over-the-counter throughout the day to help with his eyes.  Patient was given return precautions. Patient stable for discharge at this time.  Patient verbalized understanding of plan.  This chart was dictated using voice recognition  software.  Despite best efforts to proofread,  errors can occur which can change the documentation meaning.        Final Clinical Impression(s) / ED Diagnoses Final diagnoses:  Eye irritation    Rx / DC Orders ED Discharge Orders     None         Elex Grimmer 02/02/24 0936    Alissa April, MD 02/07/24 2227

## 2024-02-02 NOTE — ED Triage Notes (Signed)
 Pt arrived from home via POV c/o foreign body in bilateral eyes. Pt believes that he may have gotten metal in them at work yesterday afternoon. Pt states that he is unable to open either eye

## 2024-02-02 NOTE — Discharge Instructions (Signed)
 Please follow-up with the ophthalmologist I have attached you for you today in regards to recent symptoms and ER visit.  Today your physical exam was ultimately reassuring you may have irritation to your eyes.  Please use saline drops over the counter and if symptoms change or worsen please return to the ER.

## 2024-02-02 NOTE — ED Notes (Signed)
 Ph ophthalmic strips at bedside for provider use.
